# Patient Record
Sex: Female | Born: 1975
Health system: Southern US, Community
[De-identification: ages and names within clinical notes are randomized; demographics above are authoritative.]

## PROBLEM LIST (undated history)

## (undated) DIAGNOSIS — T7840XA Allergy, unspecified, initial encounter: Secondary | ICD-10-CM

## (undated) DIAGNOSIS — G43909 Migraine, unspecified, not intractable, without status migrainosus: Secondary | ICD-10-CM

## (undated) DIAGNOSIS — D259 Leiomyoma of uterus, unspecified: Secondary | ICD-10-CM

## (undated) HISTORY — DX: Leiomyoma of uterus, unspecified: D25.9

## (undated) HISTORY — PX: CHOLECYSTECTOMY: SHX55

## (undated) HISTORY — PX: SHOULDER SURGERY: SHX246

## (undated) HISTORY — DX: Migraine, unspecified, not intractable, without status migrainosus: G43.909

## (undated) HISTORY — DX: Allergy, unspecified, initial encounter: T78.40XA

---

## 1999-07-14 ENCOUNTER — Other Ambulatory Visit: Admission: RE | Admit: 1999-07-14 | Discharge: 1999-07-14 | Payer: Self-pay | Admitting: Obstetrics & Gynecology

## 1999-08-28 ENCOUNTER — Ambulatory Visit (HOSPITAL_COMMUNITY): Admission: RE | Admit: 1999-08-28 | Discharge: 1999-08-28 | Payer: Self-pay | Admitting: Obstetrics and Gynecology

## 1999-08-28 ENCOUNTER — Encounter: Payer: Self-pay | Admitting: Obstetrics and Gynecology

## 1999-11-24 ENCOUNTER — Inpatient Hospital Stay (HOSPITAL_COMMUNITY): Admission: AD | Admit: 1999-11-24 | Discharge: 1999-11-24 | Payer: Self-pay | Admitting: Obstetrics and Gynecology

## 1999-12-22 ENCOUNTER — Inpatient Hospital Stay (HOSPITAL_COMMUNITY): Admission: AD | Admit: 1999-12-22 | Discharge: 1999-12-25 | Payer: Self-pay | Admitting: Obstetrics and Gynecology

## 2000-01-05 ENCOUNTER — Inpatient Hospital Stay (HOSPITAL_COMMUNITY): Admission: AD | Admit: 2000-01-05 | Discharge: 2000-01-05 | Payer: Self-pay | Admitting: *Deleted

## 2000-06-10 ENCOUNTER — Other Ambulatory Visit: Admission: RE | Admit: 2000-06-10 | Discharge: 2000-06-10 | Payer: Self-pay | Admitting: Obstetrics & Gynecology

## 2001-06-14 ENCOUNTER — Other Ambulatory Visit: Admission: RE | Admit: 2001-06-14 | Discharge: 2001-06-14 | Payer: Self-pay | Admitting: Obstetrics and Gynecology

## 2002-05-31 ENCOUNTER — Other Ambulatory Visit: Admission: RE | Admit: 2002-05-31 | Discharge: 2002-05-31 | Payer: Self-pay | Admitting: Obstetrics and Gynecology

## 2003-01-15 ENCOUNTER — Encounter: Admission: RE | Admit: 2003-01-15 | Discharge: 2003-01-15 | Payer: Self-pay | Admitting: Family Medicine

## 2003-01-15 ENCOUNTER — Encounter: Payer: Self-pay | Admitting: Family Medicine

## 2003-06-07 ENCOUNTER — Other Ambulatory Visit: Admission: RE | Admit: 2003-06-07 | Discharge: 2003-06-07 | Payer: Self-pay | Admitting: Obstetrics and Gynecology

## 2004-06-08 ENCOUNTER — Other Ambulatory Visit: Admission: RE | Admit: 2004-06-08 | Discharge: 2004-06-08 | Payer: Self-pay | Admitting: Obstetrics and Gynecology

## 2004-07-28 ENCOUNTER — Emergency Department (HOSPITAL_COMMUNITY): Admission: EM | Admit: 2004-07-28 | Discharge: 2004-07-29 | Payer: Self-pay | Admitting: Emergency Medicine

## 2004-07-29 ENCOUNTER — Ambulatory Visit: Payer: Self-pay | Admitting: Family Medicine

## 2004-07-30 ENCOUNTER — Encounter: Admission: RE | Admit: 2004-07-30 | Discharge: 2004-07-30 | Payer: Self-pay | Admitting: Family Medicine

## 2004-08-18 ENCOUNTER — Encounter (INDEPENDENT_AMBULATORY_CARE_PROVIDER_SITE_OTHER): Payer: Self-pay | Admitting: *Deleted

## 2004-08-18 ENCOUNTER — Ambulatory Visit (HOSPITAL_COMMUNITY): Admission: RE | Admit: 2004-08-18 | Discharge: 2004-08-19 | Payer: Self-pay | Admitting: General Surgery

## 2004-10-20 ENCOUNTER — Ambulatory Visit: Payer: Self-pay | Admitting: Family Medicine

## 2004-11-25 ENCOUNTER — Ambulatory Visit: Payer: Self-pay | Admitting: Family Medicine

## 2004-12-01 ENCOUNTER — Encounter: Admission: RE | Admit: 2004-12-01 | Discharge: 2004-12-01 | Payer: Self-pay | Admitting: Family Medicine

## 2004-12-01 ENCOUNTER — Ambulatory Visit: Payer: Self-pay | Admitting: Family Medicine

## 2005-08-30 ENCOUNTER — Ambulatory Visit: Payer: Self-pay | Admitting: Cardiology

## 2005-08-30 ENCOUNTER — Ambulatory Visit: Payer: Self-pay | Admitting: Family Medicine

## 2005-10-07 ENCOUNTER — Ambulatory Visit: Payer: Self-pay | Admitting: Family Medicine

## 2005-10-11 ENCOUNTER — Ambulatory Visit: Payer: Self-pay | Admitting: Family Medicine

## 2005-10-11 ENCOUNTER — Encounter: Admission: RE | Admit: 2005-10-11 | Discharge: 2005-10-11 | Payer: Self-pay | Admitting: Family Medicine

## 2005-10-15 ENCOUNTER — Encounter: Admission: RE | Admit: 2005-10-15 | Discharge: 2005-10-15 | Payer: Self-pay | Admitting: Family Medicine

## 2005-12-30 ENCOUNTER — Ambulatory Visit: Payer: Self-pay | Admitting: Internal Medicine

## 2006-08-04 ENCOUNTER — Ambulatory Visit: Payer: Self-pay | Admitting: Family Medicine

## 2006-12-01 ENCOUNTER — Ambulatory Visit: Payer: Self-pay | Admitting: Internal Medicine

## 2006-12-05 ENCOUNTER — Ambulatory Visit: Payer: Self-pay | Admitting: Internal Medicine

## 2006-12-05 LAB — CONVERTED CEMR LAB
Eosinophils Absolute: 0.1 10*3/uL (ref 0.0–0.6)
Glucose, Bld: 73 mg/dL (ref 70–99)
HDL: 48.2 mg/dL (ref >39.0)
MCV: 80.3 fL (ref 78.0–100.0)
Monocytes Relative: 7.3 % (ref 3.0–11.0)
Neutro Abs: 4.1 10*3/uL (ref 1.4–7.7)
TSH: 1.86 microintl units/mL (ref 0.35–5.50)
VLDL: 18 mg/dL (ref 0–40)
WBC: 7 10*3/uL (ref 4.5–10.5)

## 2007-01-16 DIAGNOSIS — G43909 Migraine, unspecified, not intractable, without status migrainosus: Secondary | ICD-10-CM | POA: Insufficient documentation

## 2007-01-16 DIAGNOSIS — Z87898 Personal history of other specified conditions: Secondary | ICD-10-CM | POA: Insufficient documentation

## 2009-04-24 ENCOUNTER — Ambulatory Visit: Payer: Self-pay | Admitting: Family Medicine

## 2009-04-24 LAB — CONVERTED CEMR LAB
Bilirubin Urine: NEGATIVE
Urobilinogen, UA: NEGATIVE
pH: 6

## 2009-04-27 LAB — CONVERTED CEMR LAB
AST: 23 units/L (ref 0–37)
Alkaline Phosphatase: 45 units/L (ref 39–117)
BUN: 8 mg/dL (ref 6–23)
Bilirubin, Direct: 0 mg/dL (ref 0.0–0.3)
Cholesterol: 186 mg/dL (ref 0–200)
Glucose, Bld: 82 mg/dL (ref 70–99)
HCT: 35.4 % — ABNORMAL LOW (ref 36.0–46.0)
Hemoglobin: 11.7 g/dL — ABNORMAL LOW (ref 12.0–15.0)
Lymphs Abs: 1.9 10*3/uL (ref 0.7–4.0)
MCHC: 33.1 g/dL (ref 30.0–36.0)
Monocytes Absolute: 0.3 10*3/uL (ref 0.1–1.0)
Monocytes Relative: 5.5 % (ref 3.0–12.0)
Neutro Abs: 2.5 10*3/uL (ref 1.4–7.7)
RBC: 4.18 M/uL (ref 3.87–5.11)
RDW: 13.7 % (ref 11.5–14.6)
Total CHOL/HDL Ratio: 4
Triglycerides: 90 mg/dL (ref 0.0–149.0)
VLDL: 18 mg/dL (ref 0.0–40.0)

## 2009-04-30 ENCOUNTER — Telehealth (INDEPENDENT_AMBULATORY_CARE_PROVIDER_SITE_OTHER): Payer: Self-pay | Admitting: *Deleted

## 2009-09-02 ENCOUNTER — Ambulatory Visit: Payer: Self-pay | Admitting: Family Medicine

## 2009-09-02 DIAGNOSIS — M549 Dorsalgia, unspecified: Secondary | ICD-10-CM | POA: Insufficient documentation

## 2009-09-30 ENCOUNTER — Ambulatory Visit: Payer: Self-pay | Admitting: Family Medicine

## 2009-09-30 DIAGNOSIS — D509 Iron deficiency anemia, unspecified: Secondary | ICD-10-CM | POA: Insufficient documentation

## 2009-10-01 ENCOUNTER — Encounter (INDEPENDENT_AMBULATORY_CARE_PROVIDER_SITE_OTHER): Payer: Self-pay | Admitting: *Deleted

## 2009-10-01 LAB — CONVERTED CEMR LAB
Basophils Relative: 0.7 % (ref 0.0–3.0)
Eosinophils Absolute: 0.3 10*3/uL (ref 0.0–0.7)
Eosinophils Relative: 4.7 % (ref 0.0–5.0)
Ferritin: 8.7 ng/mL — ABNORMAL LOW (ref 10.0–291.0)
HCT: 37.8 % (ref 36.0–46.0)
Lymphs Abs: 1.7 10*3/uL (ref 0.7–4.0)
Monocytes Absolute: 0.4 10*3/uL (ref 0.1–1.0)
Monocytes Relative: 6.7 % (ref 3.0–12.0)
Neutro Abs: 3.3 10*3/uL (ref 1.4–7.7)
RDW: 12.5 % (ref 11.5–14.6)
Saturation Ratios: 13.9 % — ABNORMAL LOW (ref 20.0–50.0)
Vitamin B-12: 626 pg/mL (ref 211–911)
WBC: 5.7 10*3/uL (ref 4.5–10.5)

## 2009-10-14 ENCOUNTER — Other Ambulatory Visit: Admission: RE | Admit: 2009-10-14 | Discharge: 2009-10-14 | Payer: Self-pay | Admitting: Family Medicine

## 2009-10-14 ENCOUNTER — Ambulatory Visit: Payer: Self-pay | Admitting: Family Medicine

## 2009-11-03 ENCOUNTER — Telehealth: Payer: Self-pay | Admitting: Family Medicine

## 2010-10-27 NOTE — Assessment & Plan Note (Signed)
Summary: PAP/NS/KDC   Vital Signs:  Patient profile:   35 year old female Weight:      183 pounds Temp:     98.3 degrees F oral Pulse rate:   68 / minute Pulse rhythm:   regular BP sitting:   110 / 70  (left arm) Cuff size:   large  Vitals Entered By: Army Fossa CMA (September 30, 2009 10:17 AM) CC: Pap   History of Present Illness: Pt here for gyn exam.  No complaints.   rest of cpe done at last visit  Current Medications (verified): 1)  Multivitamins  Tabs (Multiple Vitamin) .Marland Kitchen.. 1 By Mouth Once Daily 2)  Tandem F 162-115.2-1 Mg Caps (Ferrous Fum-Iron Polysacch-Fa) .Marland Kitchen.. 1 By Mouth Once Daily  Allergies (verified): No Known Drug Allergies  Past History:  Family History: Last updated: 04/24/2009 Family History Diabetes 1st degree relative Family History Hypertension Alz. 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  Past Medical History: Reviewed history from 04/24/2009 and no changes required. Unremarkable  Past Surgical History: Reviewed history from 01/16/2007 and no changes required. Cholecystectomy  RIGHT SHOULDER SURGERY  Family History: Reviewed history from 04/24/2009 and no changes required. Family History Diabetes 1st degree relative Family History Hypertension Alz. dementia  Social History: Reviewed history from 04/24/2009 and no changes required. 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 Breasts:  No mass, nodules, thickening, tenderness, bulging, retraction, inflamation, nipple discharge or skin changes noted.   Lungs:  Normal respiratory effort, chest expands symmetrically. Lungs are clear to auscultation, no crackles or wheezes. Heart:  Normal rate and regular rhythm. S1 and S2 normal without gallop, murmur, click, rub or other extra sounds. Abdomen:  Bowel sounds positive,abdomen soft and non-tender without masses, organomegaly or hernias noted. Genitalia:  unable to do pap secondary to period Psych:  Oriented X3 and normally interactive.     Impression & Recommendations:  Problem # 1:  ROUTINE GYNECOLOGICAL EXAMINATION (ICD-V72.31)  unable to do pap secondary to period recheck 2-3 weeks  Orders: No Charge Patient Arrived (NCPA0) (NCPA0)  Problem # 2:  ANEMIA, IRON DEFICIENCY (ICD-280.9)  Her updated medication list for this problem includes:    Tandem F 162-115.2-1 Mg Caps (Ferrous fum-iron polysacch-fa) .Marland Kitchen... 1 by mouth once daily  Orders: Venipuncture (96295) TLB-B12 + Folate Pnl (28413_24401-U27/OZD) TLB-IBC Pnl (Iron/FE;Transferrin) (83550-IBC) TLB-CBC Platelet - w/Differential (85025-CBCD) TLB-Ferritin (82728-FER) No Charge Patient Arrived (NCPA0) (NCPA0)  Hgb: 11.7 (04/24/2009)   Hct: 35.4 (04/24/2009)   Platelets: 271.0 (04/24/2009) RBC: 4.18 (04/24/2009)   RDW: 13.7 (04/24/2009)   WBC: 4.9 (04/24/2009) MCV: 84.7 (04/24/2009)   MCHC: 33.1 (04/24/2009) TSH: 1.46 (04/24/2009)  Complete Medication List: 1)  Multivitamins Tabs (Multiple vitamin) .Marland Kitchen.. 1 by mouth once daily 2)  Tandem F 162-115.2-1 Mg Caps (Ferrous fum-iron polysacch-fa) .Marland Kitchen.. 1 by mouth once daily

## 2010-10-27 NOTE — Progress Notes (Signed)
Summary: WANTS TO GO BACK TO "THE PILL"  Phone Note Call from Patient Call back at [phone]   Caller: Patient Summary of Call: PATIENT SAYS THAT BIRTH CONTROL PATCH IS MAKING HER LEGS FEEL NUMB---WANTS TO GO BACK TO THE PILL--PLEASE CALL [facility] ON [address]--PLEASE CALL HER WHEN NEW PRESCRIPTION HAS BEEN CALLED IN  Follow-up for Phone Call        yaz #1  as directed 11 refills Follow-up by: Loreen Freud DO,  November 03, 2009 1:54 PM  Additional Follow-up for Phone Call Additional follow up Details #1::        Pt is aware. Army Fossa CMA  November 03, 2009 1:56 PM     New/Updated Medications: YAZ 3-0.02 MG TABS (DROSPIRENONE-ETHINYL ESTRADIOL) as directed. Prescriptions: YAZ 3-0.02 MG TABS (DROSPIRENONE-ETHINYL ESTRADIOL) as directed.  #1 x 11    Entered by:   Army Fossa CMA   Authorized by:   Loreen Freud DO   Signed by:   Army Fossa CMA on 11/03/2009   Method used:   Faxed to ...       [facility] [address] (retail)       [phone]       [place], [place]  [address]       Ph: [phone]       Fax: [phone]   RxID:   [id number]

## 2010-10-27 NOTE — Assessment & Plan Note (Signed)
Summary: PAP/NS/KDC   Vital Signs:  Patient profile:   35 year old female Weight:      182 pounds Temp:     99.2 degrees F oral Pulse rate:   72 / minute Pulse rhythm:   regular BP sitting:   112 / 80  (left arm) Cuff size:   large  Vitals Entered By: Army Fossa CMA (October 14, 2009 10:01 AM) CC: PAP only.    History of Present Illness: Pt here for pap only.  Rest of exam done last visit.     Current Medications (verified): 1)  Multivitamins  Tabs (Multiple Vitamin) .Marland Kitchen.. 1 By Mouth Once Daily 2)  Tandem F 162-115.2-1 Mg Caps (Ferrous Fum-Iron Polysacch-Fa) .Marland Kitchen.. 1 By Mouth Once Daily 3)  Ortho Evra 150-20 Mcg/24hr Ptwk (Norelgestromin-Eth Estradiol) .Marland Kitchen.. 1 Patch Q Week For 3 Weeks Then Off 1 Week  Allergies (verified): No Known Drug Allergies  Past History:  Past medical, surgical, family and social histories (including risk factors) reviewed for relevance to current acute and chronic problems.  Past Medical History: Reviewed history from 04/24/2009 and no changes required. Unremarkable  Past Surgical History: Reviewed history from 01/16/2007 and no changes required. Cholecystectomy  RIGHT SHOULDER SURGERY  Family History: Reviewed history from 04/24/2009 and no changes required. Family History Diabetes 1st degree relative Family History Hypertension Alz. dementia  Social History: Reviewed history from 04/24/2009 and no changes required. 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 Genitalia:  Pelvic Exam:        External: normal female genitalia without lesions or masses        Vagina: normal without lesions or masses  Cervix: normal without lesions or masses        Adnexa: normal bimanual exam without masses or fullness        Uterus: normal by palpation        Pap smear: performed Cervical Nodes:  No lymphadenopathy noted Psych:  Cognition and judgment appear intact. Alert and cooperative with normal attention span and concentration. No apparent delusions, illusions, hallucinations   Impression & Recommendations:  Problem # 1:  ROUTINE GYNECOLOGICAL EXAMINATION (ICD-V72.31) see last ov  pap done rx ortho evra---pt understands risks and how to use  Problem # 2:  ANEMIA, IRON DEFICIENCY (ICD-280.9)  Her updated medication list for this problem includes:    Tandem F 162-115.2-1 Mg Caps (Ferrous fum-iron polysacch-fa) .Marland Kitchen... 1 by mouth once daily  Hgb: 12.2 (09/30/2009)   Hct: 37.8 (09/30/2009)   Platelets: 265.0 (09/30/2009) RBC: 4.21 (09/30/2009)   RDW: 12.5 (09/30/2009)   WBC: 5.7 (09/30/2009) MCV: 89.8 (09/30/2009)   MCHC: 32.3 (09/30/2009) Ferritin: 8.7 (09/30/2009) Iron: 64 (09/30/2009)   % Sat: 13.9 (09/30/2009) B12: 626 (09/30/2009)   Folate: >20.0 ng/mL (09/30/2009)   TSH: 1.46 (04/24/2009)  Complete Medication List: 1)  Multivitamins Tabs (Multiple vitamin) .Marland Kitchen.. 1 by mouth once daily 2)  Tandem F 162-115.2-1 Mg Caps (Ferrous fum-iron polysacch-fa) .Marland Kitchen.. 1 by mouth once daily 3)  Ortho Evra 150-20 Mcg/24hr Ptwk (Norelgestromin-eth estradiol) .Marland Kitchen.. 1 patch q week for 3 weeks then off 1 week Prescriptions: ORTHO EVRA 150-20 MCG/24HR PTWK (NORELGESTROMIN-ETH ESTRADIOL) 1 patch q week for 3 weeks then off 1 week  #3 x 11  Entered and Authorized by:   Loreen Freud DO   Signed by:   Loreen Freud DO on 10/14/2009   Method used:   Electronically to        [facility] [address] (retail)       [phone]       [place], [place]  [address]       Ph: [phone]       Fax: [phone]   RxID:   [id number]

## 2010-10-27 NOTE — Letter (Signed)
Summary: Results Follow up Letter  [facility]  [address] [place], [place] [address]   Phone: [phone]  Fax: [phone]    10/01/2009 MRN: [id number]  Sheena Olsen [address] [place], [place]  [address]  Dear [name],  The following are the results of your recent test(s):  Test         Result    Pap Smear:        Normal _____  Not Normal _____ Comments: ______________________________________________________ Cholesterol: LDL(Bad cholesterol):         Your goal is less than:         HDL (Good cholesterol):       Your goal is more than: Comments:  ______________________________________________________ Mammogram:        Normal _____  Not Normal _____ Comments:  ___________________________________________________________________ Hemoccult:        Normal _____  Not normal _______ Comments:    _____________________________________________________________________ Other Tests:  See attachment for results.  We routinely do not discuss normal results over the telephone.  If you desire a copy of the results, or you have any questions about this information we can discuss them at your next office visit.   Sincerely,  Army Fossa CMA  October 01, 2009 8:15 AM

## 2011-02-12 NOTE — Op Note (Signed)
[facility]  Patient:    Sheena Olsen, Sheena Olsen                       MRN: [id number] Proc. Date: 12/22/99 Adm. Date:  [id number] Attending:  Dierdre Forth Pearline                           Operative Report  PREOPERATIVE DIAGNOSIS:       Intrauterine pregnancy at term.  Prior cesarean section of unknown type, desire for repeat cesarean section.  POSTOPERATIVE DIAGNOSIS:      Intrauterine pregnancy at term.  Prior cesarean section of unknown type, desire for repeat cesarean section.  OPERATION:                    Repeat low transverse cesarean section.  SURGEON:                      Vanessa P. Pennie Rushing, M.D.  ASSISTANT:                    Erin Sons, C.N.M.  ANESTHESIA:                   Spinal.  ESTIMATED BLOOD LOSS:         1000 cc.  COMPLICATIONS:                None.  FINDINGS:                     The uterus was enlarged for the gravid state and he tubes bilaterally appeared normal.  The left ovary was visualized and appeared normal.  The right ovary could not be visualized.  There were extensive anterior fundal adhesions to the anterior peritoneum.  The patient was delivered of a female infant weighing 7 pounds 11 ounces with Apgars of 8 and 9 at one and five minutes, respectively.  DESCRIPTION OF PROCEDURE:     The patient was taken to the operating room after  appropriate identification and placed on the operating table.  After placement f a spinal anesthetic, she was placed in the supine position with a left lateral tilt. The abdomen and perineum were prepped with multiple layers of Betadine and a Foley catheter inserted into the bladder under sterile conditions and connected to straight drainage.  The abdomen was draped as a sterile field.  After assurance of adequate anesthesia, a transverse incision was made in the abdomen and the abdomen opened in layers.  The entry into the peritoneum was difficult secondary to multiple  anterior fundal adhesions.  Once entry had been documented, the adhesions were bluntly lysed and the visceroperitoneum overlying the uterus incised and the incision taken laterally on either side.  The bladder flap was bluntly developed and the bladder blade placed.  The uterus was incised in the midline and then incision taken laterally on either side.  The infant was delivered from the occipitoposterior position with the aid of a Mity-Vac vacuum extractor and after having the nares and pharynx suctioned and the cord clamped and cut, was handed off to the awaiting pediatricians.  The appropriate cord blood was drawn and the placenta manually removed from the uterus.  The uterine cavity was cleared of products of conception with laparotomy pads.  The uterine incision was closed with a running interlocking suture of 0 Vicryl.  Hemostasis was achieved with figure-of-eight  sutures of 0 Vicryl.  Copious irrigation was carried out.  The bladder flap was repaired with a running suture of 2-0 Vicryl.  The rectus muscles were reapproximated in the midline with figure-of-eight sutures of 2-0 Vicryl. The rectus fascia was closed with a running suture of 0 Vicryl and then reinforced n either side of the midline with figure-of-eight sutures of 0 Vicryl.  Copious irrigation was carried out and hemostasis achieved with Bovie cautery.  Skin staples were applied to the skin incision and a sterile pressure dressing was applied.  The patient was taken from the operating room to the recovery room in  satisfactory condition having tolerated the procedure well with sponge, needle, and instrument counts were correct.  The infant went to the fullterm nursery. DD:  12/22/99 TD:  12/22/99 Job: [id number]

## 2011-02-12 NOTE — Discharge Summary (Signed)
[facility]  Patient:    Sheena Olsen, Sheena Olsen                       MRN: [id number] Adm. Date:  [id number] Disc. Date: 12/25/99 Attending:  Shaune Spittle Dictator:   Miguel Dibble, C.N.M.                           Discharge Summary  ADMISSION DIAGNOSES:          1. Intrauterine pregnancy at term.                               2. Previous cesarean section of unknown type.                               3. Positive Group B Strep.  DISCHARGE DIAGNOSES:          1. Intrauterine pregnancy at term.                               2. Previous cesarean section of unknown type.                               3. Delivered by repeat cesarean section, lower  segment transverse incision.                               4. Viable babygirl, weight 7 pounds 11 ounces,                                  Apgars 8 and 9.                               5. Anterior uterine adhesions.  PROCEDURE:                    Spinal anesthesia, fetal monitoring.  HOSPITAL COURSE:              On March 27, Ms. Esmeralda Malay was admitted for repeat cesarean section.  She had a previous history of positive Group B Strep nd hypertension in her first pregnancy which did not present in her second pregnancy. Due to the unknown nature of her previous cesarean section scar that was performed under emergency conditions.  The decision was made to repeat her cesarean section. She delivered a viable babygirl, Melissa, weighing 7 pounds 11 ounces, Apgars 8 and 9.  She had extensive anterior uterine adhesions.  On postoperative day #1, her  temperature during that 24-hour period climbed to as high as 100.9 and hemoglobin dropped from 12.3 to hemoglobin of 9.2.  White count increased from 8.8 to 10.7. Incision was clean, dry, and intact.  Abdomen soft and appropriately tender. On postoperative day #2, she was afebrile by morning.  Her maximum temperature overnight had been 100.9.  Her  incision was clean, dry, and intact.  Lungs were  clear and she was tolerating her diet well.  She desired Depo-Provera for contraception.  On postoperative day #2, her maximum temperature  over the previous 24-hours was 98.1.  Incision was clean and dry.  She had some breastfeeding issues, but baby appeared to have been latching on well.  She was voiding well, lochia as small.  On postoperative day #3, she appeared to have developed a small 1 cm hematoma in the center of her incision, however, there was no wound dehiscence, no drainage.  She was afebrile for greater than 24 hours.  Her abdomen was soft and nontender.  Her lungs were clear.  Extremities had trace edema, DTRs +1, negative Homans.  The decision was made to maintain her staples until April 2 at which time they would be removed in the office and Steri-Strips applied.  She was discharged home in stable condition.  DISCHARGE INSTRUCTIONS:       Per [facility] and Gynecology booklet.  Wound care by applying Maalox over the weekend.  FOLLOW-UP:                    On December 28, 1999, and also at six weeks postoperative.  DISCHARGE MEDICATIONS:        1. Tylox.                               2. Motrin.                               3. Depo-Provera prior to discharge. DD:  12/25/99 TD:  12/25/99 Job: [id number]

## 2011-02-12 NOTE — Op Note (Signed)
Sheena Olsen, CARTA NO.:  [id number]   MEDICAL RECORD NO.:  [id number]          PATIENT TYPE:  OIB   LOCATION:  2550                         FACILITY:  [facility]   PHYSICIAN:  Gita Kudo, M.D. DATE OF BIRTH:  1975-10-29   DATE OF PROCEDURE:  08/18/2004  DATE OF DISCHARGE:                                 OPERATIVE REPORT   PREOPERATIVE DIAGNOSIS:  Gallstones.   POSTOPERATIVE DIAGNOSIS:  Gallstones.   OPERATION PERFORMED:  Laparoscopic cholecystectomy with intraoperative  cholangiogram.   SURGEON:  Gita Kudo, M.D.   ASSISTANT:  Sandria Bales. Ezzard Standing, M.D.   ANESTHESIA:  General.   INDICATIONS FOR PROCEDURE:  The patient is a 35 year old female with severe  bouts of right upper quadrant abdominal pain.  Gallbladder ultrasound shows  multiple stones.  Liver function studies were normal.   OPERATIVE FINDINGS:  The gallbladder was distended and filled with multiple  stones.  The cystic duct and artery were normal in anatomy and the  cholangiogram looked normal.  A small hole was made in the gallbladder  during the dissection but fortunately only a little bile came out and no  gallstones.   DESCRIPTION OF PROCEDURE:  Under satisfactory general endotracheal  anesthesia, the patient was positioned, prepped and draped in standard  fashion.  She received IV Ancef preop.  A 30 mL of 0.5% Marcaine with  epinephrine was infiltrated for postoperative analgesia.  A transverse  incision was made above the umbilicus, carried down to the midline, which  was opened into the peritoneum.  Controlled with a figure-of-eight 0 Vicryl  suture, operating Hasson port inserted and secured.  Good CO2  pneumoperitoneum was established.  Under direct vision, two #5 ports placed  laterally and a second #10 medially.  With lateral graspers giving good  exposure, operating through the medial port, I carefully dissected the  cystic duct and cystic artery.  The duct was controlled  with a single clip  near the gallbladder and an incision made in it and percutaneous catheter  placed with good cholangiogram obtained.  The catheter was drawn and the  duct controlled with multiple clips distally and divided.  The main artery  had also posterior branches divided between multiple clips.  The gallbladder  removed from below upward using coagulating spatula for hemostasis and  dissection.  It was a little thin-walled and a small hole made with spillage  of a small amount of clear bile.  The dissection continued and then when  removed, the gallbladder was placed in an EndoCatch bag.  Following this,  the operative site was checked for hemostasis, lavaged with saline and  suctioned dry.  Cameral then moved to the lower port and through the  umbilical port, a large grasper used to retrieve the encased gallbladder  intact and without spillage or problem.   I felt the gallbladder and it was packed with tiny stones and fortunately  none of these escaped into the abdominal cavity.  Then the operative site  again checked, lavaged and suctioned dry.  CO2 and ports were released.  Midline  closed with a  previous figure-of-eight as well as a second interrupted 0  Vicryl suture.  Then subcu approximated with 4-0 Vicryl and Steri-Strips for  skin.  Sterile absorbent dressings were then applied and the patient went to  the recovery room from the operating room in good condition without  complication.       MRL/MEDQ  D:  08/18/2004  T:  08/18/2004  Job:  [id number]   cc:   Loreen Freud, M.D.

## 2011-02-12 NOTE — H&P (Signed)
[facility]  Patient:    Sheena Olsen, Sheena Olsen                       MRN: [id number] Adm. Date:  [id number] Disc. Date: [id number] Attending:  Shaune Spittle Dictator:   Nigel Bridgeman, C.N.M.                         History and Physical  HISTORY OF PRESENT ILLNESS:   [name] is a 35 year old gravida 3, para 2-0-0-1 at 38-6/7 weeks, who presents for scheduled repeat cesarean section.  Pregnancy has been remarkable for:  #1 - Two previous cesarean sections, the first one with an unknown incision type; #2 - previous stillbirth; #3 - family history of pyloric  stenosis; #4 - positive group B strep.  PRENATAL LABORATORY DATA:     Blood type is AB-positive.  Rh-antibody negative.  VDRL nonreactive.  Rubella titer positive.  Hepatitis B surface antigen negative. Sickle cell test negative.  Pap was normal.  GC and Chlamydia cultures were negative in the first trimester.  TSH was normal.  Pap was also normal. Glucose challenge was normal.  AFP was declined.  Group B strep was positive for a previous pregnancy.  Hemoglobin upon entry into practice was 13.4; it was within normal limits at 28 weeks.  EDC of December 29, 1999 was established by ultrasound in the first trimester secondary to questionable last menstrual period.  HISTORY OF PRESENT PREGNANCY:  Patient entered care at [facility] at approximately 16 weeks.  She had been followed for prenatal care in [place]. She denied AFP.  She reported after her last cesarean they saw that she had a horizontal incision on the uterus with her first C-section.  This C-section occurred in [place] but no records were able to be obtained; therefore, Dr. Erie Noe P. Olsen recommended repeat cesarean section.  She had an ultrasound at [facility] secondary to family history of pyloric stenosis.  The rest of the patients pregnancy was essentially uncomplicated.  OBSTETRICAL HISTORY:          In  1990, patient had a C-section under general anesthesia for a female infant at 82 weeks.  She did have a hemorrhage and that  infant was stillborn.  There were no records available from that birth.  In 1999, she had a C-section under epidural anesthesia for a female infant, weight 8 pounds 8 ounces, at 40-weeks gestation.  She did have decreased blood pressure during her delivery but otherwise had a normal pregnancy.  She did have elevated blood pressure during her 1990 pregnancy.  MEDICAL HISTORY:              She was on oral contraceptives until April of 2000. She reports the usual childhood illnesses.  In 1997, she had a UTI.  At age 45, he had hernia surgery.  Her only other hospitalizations were for childbirth and her two previous cesarean sections.  ALLERGIES:                    She has no known medication allergies.  FAMILY HISTORY:               Her maternal aunt has hypertension.  Her mother and sister have asthma.  Her maternal great-aunt has diabetes.  GENETIC HISTORY:              Genetic history is remarkable for the father of  the baby and the father of the babys niece having pyloric stenosis.  SOCIAL HISTORY:               Patient is married to the father of the baby; he s involved and supportive.  His name is Smitty Cords.  The patient is high-school-educated.  She is a Futures trader.  Her husband is also high-school-educated and he is employed as a Production designer, theatre/television/film.  She has been followed by the physicians service at [facility].  She denies any alcohol, drug or tobacco use during this pregnancy.  PHYSICAL EXAMINATION:  VITAL SIGNS:                  Stable.  Patient is afebrile.  HEENT:                        Within normal limits.  LUNGS:                        Bilateral breath sounds are clear.  HEART:                        Regular rate and rhythm without murmur.  BREASTS:                      Soft and nontender.  ABDOMEN:                      Fundal  height is approximately 39 cm.  Estimated fetal weight is 8 to 8-1/2 pounds.  Uterine contractions are very occasional and mild.  Fetal heart rate is in the 150s by Doppler.  EXTREMITIES:                  Deep tendon reflexes are 2+ without clonus. There is a trace edema noted.  IMPRESSION:                   1. Intrauterine pregnancy at 38-6/7 weeks.                               2. Two previous cesarean sections, with planned                                  repeat.                               3. History of positive group B streptococcus.                               4. History of previous stillborn.  PLAN:                         1. Admit to birthing suite per consult with                                  Dr. Maris Berger. Olsen as attending physician.  2. Routine physician preop C-section orders. DD:  12/22/99 TD:  12/22/99 Job: [id number]

## 2011-11-23 ENCOUNTER — Other Ambulatory Visit (HOSPITAL_COMMUNITY)
Admission: RE | Admit: 2011-11-23 | Discharge: 2011-11-23 | Disposition: A | Discharge status: Home / Self Care | Payer: BC Managed Care – PPO | Source: Ambulatory Visit | Attending: Obstetrics and Gynecology | Admitting: Obstetrics and Gynecology

## 2011-11-23 ENCOUNTER — Other Ambulatory Visit: Payer: Self-pay | Admitting: Nurse Practitioner

## 2011-11-23 DIAGNOSIS — Z1159 Encounter for screening for other viral diseases: Secondary | ICD-10-CM | POA: Insufficient documentation

## 2011-11-23 DIAGNOSIS — Z01419 Encounter for gynecological examination (general) (routine) without abnormal findings: Secondary | ICD-10-CM | POA: Insufficient documentation

## 2011-11-23 DIAGNOSIS — Z113 Encounter for screening for infections with a predominantly sexual mode of transmission: Secondary | ICD-10-CM | POA: Insufficient documentation

## 2012-02-15 ENCOUNTER — Ambulatory Visit (INDEPENDENT_AMBULATORY_CARE_PROVIDER_SITE_OTHER): Payer: BC Managed Care – PPO | Admitting: Family Medicine

## 2012-02-15 ENCOUNTER — Encounter: Payer: Self-pay | Admitting: Family Medicine

## 2012-02-15 VITALS — BP 112/74 | HR 84 | Temp 98.7°F | Ht 61.0 in | Wt 185.0 lb

## 2012-02-15 DIAGNOSIS — J309 Allergic rhinitis, unspecified: Secondary | ICD-10-CM

## 2012-02-15 DIAGNOSIS — J302 Other seasonal allergic rhinitis: Secondary | ICD-10-CM

## 2012-02-15 NOTE — Progress Notes (Signed)
  Subjective:     Sheena Olsen is a 36 y.o. female who presents for evaluation and treatment of allergic symptoms. Symptoms include: clear rhinorrhea and are present in a seasonal pattern. Precipitants include: dust, pollen she thinks.. Treatment currently includes oral antihistamines: allegra and zyrtec  and is not effective. The following portions of the patient's history were reviewed and updated as appropriate: allergies, current medications, past family history, past medical history, past social history, past surgical history and problem list.  Review of Systems Pertinent items are noted in HPI.    Objective:    BP 112/74  Pulse 84  Temp(Src) 98.7 F (37.1 C) (Oral)  Ht 5\' 1"  (1.549 m)  Wt 185 lb (83.915 kg)  BMI 34.96 kg/m2  SpO2 99% General appearance: alert, cooperative, appears stated age and no distress Ears: normal TM's and external ear canals both ears   cor  +s1s2  Lungs --Ctab;l  Nose--clear drainage Assessment:    Allergic rhinitis.    Plan:    Medications: intranasal steroids: -, intranasal antihistamines: -, oral antihistamines: -. Allergen avoidance discussed. Follow-up in 4 weeks.

## 2012-02-15 NOTE — Patient Instructions (Signed)
Allergies, Generic Allergies may happen from anything your body is sensitive to. This may be food, medicines, pollens, chemicals, and nearly anything around you in everyday life that produces allergens. An allergen is anything that causes an allergy producing substance. Heredity is often a factor in causing these problems. This means you may have some of the same allergies as your parents. Food allergies happen in all age groups. Food allergies are some of the most severe and life threatening. Some common food allergies are cow's milk, seafood, eggs, nuts, wheat, and soybeans. SYMPTOMS   Swelling around the mouth.   An itchy red rash or hives.   Vomiting or diarrhea.   Difficulty breathing.  SEVERE ALLERGIC REACTIONS ARE LIFE-THREATENING. This reaction is called anaphylaxis. It can cause the mouth and throat to swell and cause difficulty with breathing and swallowing. In severe reactions only a trace amount of food (for example, peanut oil in a salad) may cause death within seconds. Seasonal allergies occur in all age groups. These are seasonal because they usually occur during the same season every year. They may be a reaction to molds, grass pollens, or tree pollens. Other causes of problems are house dust mite allergens, pet dander, and mold spores. The symptoms often consist of nasal congestion, a runny itchy nose associated with sneezing, and tearing itchy eyes. There is often an associated itching of the mouth and ears. The problems happen when you come in contact with pollens and other allergens. Allergens are the particles in the air that the body reacts to with an allergic reaction. This causes you to release allergic antibodies. Through a chain of events, these eventually cause you to release histamine into the blood stream. Although it is meant to be protective to the body, it is this release that causes your discomfort. This is why you were given anti-histamines to feel better. If you are  unable to pinpoint the offending allergen, it may be determined by skin or blood testing. Allergies cannot be cured but can be controlled with medicine. Hay fever is a collection of all or some of the seasonal allergy problems. It may often be treated with simple over-the-counter medicine such as diphenhydramine. Take medicine as directed. Do not drink alcohol or drive while taking this medicine. Check with your caregiver or package insert for child dosages. If these medicines are not effective, there are many new medicines your caregiver can prescribe. Stronger medicine such as nasal spray, eye drops, and corticosteroids may be used if the first things you try do not work well. Other treatments such as immunotherapy or desensitizing injections can be used if all else fails. Follow up with your caregiver if problems continue. These seasonal allergies are usually not life threatening. They are generally more of a nuisance that can often be handled using medicine. HOME CARE INSTRUCTIONS   If unsure what causes a reaction, keep a diary of foods eaten and symptoms that follow. Avoid foods that cause reactions.   If hives or rash are present:   Take medicine as directed.   You may use an over-the-counter antihistamine (diphenhydramine) for hives and itching as needed.   Apply cold compresses (cloths) to the skin or take baths in cool water. Avoid hot baths or showers. Heat will make a rash and itching worse.   If you are severely allergic:   Following a treatment for a severe reaction, hospitalization is often required for closer follow-up.   Wear a medic-alert bracelet or necklace stating the allergy.     You and your family must learn how to give adrenaline or use an anaphylaxis kit.   If you have had a severe reaction, always carry your anaphylaxis kit or EpiPen with you. Use this medicine as directed by your caregiver if a severe reaction is occurring. Failure to do so could have a fatal  outcome.  SEEK MEDICAL CARE IF:  You suspect a food allergy. Symptoms generally happen within 30 minutes of eating a food.   Your symptoms have not gone away within 2 days or are getting worse.   You develop new symptoms.   You want to retest yourself or your child with a food or drink you think causes an allergic reaction. Never do this if an anaphylactic reaction to that food or drink has happened before. Only do this under the care of a caregiver.  SEEK IMMEDIATE MEDICAL CARE IF:   You have difficulty breathing, are wheezing, or have a tight feeling in your chest or throat.   You have a swollen mouth, or you have hives, swelling, or itching all over your body.   You have had a severe reaction that has responded to your anaphylaxis kit or an EpiPen. These reactions may return when the medicine has worn off. These reactions should be considered life threatening.  MAKE SURE YOU:   Understand these instructions.   Will watch your condition.   Will get help right away if you are not doing well or get worse.  Document Released: 12/07/2002 Document Revised: 09/02/2011 Document Reviewed: 05/13/2008 [employer] Patient Information 2012 [employer]. 

## 2012-02-16 LAB — CBC WITH DIFFERENTIAL/PLATELET
Basophils Relative: 0.7 % (ref 0.0–3.0)
Eosinophils Absolute: 0.2 10*3/uL (ref 0.0–0.7)
Eosinophils Relative: 3.3 % (ref 0.0–5.0)
HCT: 32.6 % — ABNORMAL LOW (ref 36.0–46.0)
Hemoglobin: 10.1 g/dL — ABNORMAL LOW (ref 12.0–15.0)
Lymphs Abs: 2.2 10*3/uL (ref 0.7–4.0)
MCV: 69.9 fl — ABNORMAL LOW (ref 78.0–100.0)
Monocytes Absolute: 0.3 10*3/uL (ref 0.1–1.0)
Neutro Abs: 1.9 10*3/uL (ref 1.4–7.7)
Neutrophils Relative %: 41.6 % — ABNORMAL LOW (ref 43.0–77.0)

## 2012-02-17 LAB — ALLERGY FULL PROFILE
Allergen,Goose feathers, e70: <0.1 kU/L (ref <0.35)
Alternaria Alternata: <0.1 kU/L (ref <0.35)
Aspergillus fumigatus, IgG: <0.1 kU/L (ref <0.35)
Bermuda Grass: 1.38 kU/L — ABNORMAL HIGH (ref <0.35)
Candida Albicans: <0.1 kU/L (ref <0.35)
Curvularia lunata: <0.1 kU/L (ref <0.35)
D. farinae: <0.1 kU/L (ref <0.35)
Elm IgE: 1.25 kU/L — ABNORMAL HIGH (ref <0.35)
Fescue: 2.87 kU/L — ABNORMAL HIGH (ref <0.35)
Goldenrod: 1.77 kU/L — ABNORMAL HIGH (ref <0.35)
Helminthosporium halodes: <0.1 kU/L (ref <0.35)
IgE (Immunoglobulin E), Serum: 51.4 IU/mL (ref 0.0–180.0)
Stemphylium Botryosum: <0.1 kU/L (ref <0.35)

## 2012-11-11 ENCOUNTER — Other Ambulatory Visit: Payer: Self-pay

## 2013-04-30 ENCOUNTER — Other Ambulatory Visit: Payer: Self-pay | Admitting: Obstetrics and Gynecology

## 2013-04-30 ENCOUNTER — Inpatient Hospital Stay (HOSPITAL_COMMUNITY)
Admission: AD | Admit: 2013-04-30 | Discharge: 2013-04-30 | Disposition: A | Discharge status: Home / Self Care | Payer: BC Managed Care – PPO | Source: Ambulatory Visit | Attending: Obstetrics and Gynecology | Admitting: Obstetrics and Gynecology

## 2013-04-30 ENCOUNTER — Encounter (HOSPITAL_COMMUNITY): Payer: Self-pay | Admitting: *Deleted

## 2013-04-30 ENCOUNTER — Inpatient Hospital Stay (HOSPITAL_COMMUNITY): Payer: BC Managed Care – PPO

## 2013-04-30 DIAGNOSIS — N949 Unspecified condition associated with female genital organs and menstrual cycle: Secondary | ICD-10-CM | POA: Insufficient documentation

## 2013-04-30 DIAGNOSIS — N739 Female pelvic inflammatory disease, unspecified: Secondary | ICD-10-CM | POA: Insufficient documentation

## 2013-04-30 DIAGNOSIS — R102 Pelvic and perineal pain: Secondary | ICD-10-CM

## 2013-04-30 LAB — COMPREHENSIVE METABOLIC PANEL
Albumin: 3.2 g/dL — ABNORMAL LOW (ref 3.5–5.2)
CO2: 25 mEq/L (ref 19–32)
Calcium: 9.4 mg/dL (ref 8.4–10.5)
GFR calc Af Amer: 89 mL/min — ABNORMAL LOW (ref >90)
GFR calc non Af Amer: 77 mL/min — ABNORMAL LOW (ref >90)
Glucose, Bld: 83 mg/dL (ref 70–99)
Potassium: 3.6 mEq/L (ref 3.5–5.1)

## 2013-04-30 LAB — CBC WITH DIFFERENTIAL/PLATELET
Basophils Absolute: 0.1 10*3/uL (ref 0.0–0.1)
HCT: 37.9 % (ref 36.0–46.0)
Lymphocytes Relative: 27 % (ref 12–46)
MCH: 29.4 pg (ref 26.0–34.0)
MCV: 88.6 fL (ref 78.0–100.0)
Monocytes Absolute: 0.7 10*3/uL (ref 0.1–1.0)
Neutro Abs: 6.3 10*3/uL (ref 1.7–7.7)
Neutrophils Relative %: 64 % (ref 43–77)
Platelets: 255 10*3/uL (ref 150–400)

## 2013-04-30 MED ORDER — IBUPROFEN 600 MG PO TABS: 600.0000 mg | ORAL_TABLET | Freq: Four times a day (QID) | ORAL | Status: DC | PRN

## 2013-04-30 MED ORDER — IBUPROFEN 600 MG PO TABS
600.0000 mg | ORAL_TABLET | Freq: Once | ORAL | Status: AC
  Administered 2013-04-30: 600 mg via ORAL
  Filled 2013-04-30: qty 1

## 2013-04-30 MED ORDER — DOXYCYCLINE HYCLATE 100 MG PO CAPS: 100.0000 mg | ORAL_CAPSULE | Freq: Two times a day (BID) | ORAL | Status: DC

## 2013-04-30 MED ORDER — CEFTRIAXONE SODIUM 250 MG IJ SOLR
250.0000 mg | Freq: Once | INTRAMUSCULAR | Status: AC
  Administered 2013-04-30: 250 mg via INTRAMUSCULAR
  Filled 2013-04-30: qty 250

## 2013-04-30 MED ORDER — LACTATED RINGERS IV SOLN: INTRAVENOUS | Status: DC

## 2013-04-30 MED ORDER — OXYCODONE-ACETAMINOPHEN 5-325 MG PO TABS: ORAL_TABLET | ORAL | Status: DC

## 2013-04-30 MED ORDER — LACTATED RINGERS IV BOLUS (SEPSIS): 500.0000 mL | Freq: Once | INTRAVENOUS | Status: DC

## 2013-04-30 NOTE — Discharge Instructions (Signed)
Abdominal Pain (Nonspecific) Your exam might not show the exact reason you have abdominal pain. Since there are many different causes of abdominal pain, another checkup and more tests may be needed. It is very important to follow up for lasting (persistent) or worsening symptoms. A possible cause of abdominal pain in any person who still has his or her appendix is acute appendicitis. Appendicitis is often hard to diagnose. Normal blood tests, urine tests, ultrasound, and CT scans do not completely rule out early appendicitis or other causes of abdominal pain. Sometimes, only the changes that happen over time will allow appendicitis and other causes of abdominal pain to be determined. Other potential problems that may require surgery may also take time to become more apparent. Because of this, it is important that you follow all of the instructions below. HOME CARE INSTRUCTIONS   Rest as much as possible.  Do not eat solid food until your pain is gone.  While adults or children have pain: A diet of water, weak decaffeinated tea, broth or bouillon, gelatin, oral rehydration solutions (ORS), frozen ice pops, or ice chips may be helpful.  When pain is gone in adults or children: Start a light diet (dry toast, crackers, applesauce, or white rice). Increase the diet slowly as long as it does not bother you. Eat no dairy products (including cheese and eggs) and no spicy, fatty, fried, or high-fiber foods.  Use no alcohol, caffeine, or cigarettes.  Take your regular medicines unless your caregiver told you not to.  Take any prescribed medicine as directed.  Only take over-the-counter or prescription medicines for pain, discomfort, or fever as directed by your caregiver. Do not give aspirin to children. If your caregiver has given you a follow-up appointment, it is very important to keep that appointment. Not keeping the appointment could result in a permanent injury and/or lasting (chronic) pain  and/or disability. If there is any problem keeping the appointment, you must call to reschedule.  SEEK IMMEDIATE MEDICAL CARE IF:   Your pain is not gone in 24 hours.  Your pain becomes worse, changes location, or feels different.  You or your child has an oral temperature above 102 F (38.9 C), not controlled by medicine.  Your baby is older than 3 months with a rectal temperature of 102 F (38.9 C) or higher.  Your baby is 64 months old or younger with a rectal temperature of 100.4 F (38 C) or higher.  You have shaking chills.  You keep throwing up (vomiting) or cannot drink liquids.  There is blood in your vomit or you see blood in your bowel movements.  Your bowel movements become dark or black.  You have frequent bowel movements.  Your bowel movements stop (become blocked) or you cannot pass gas.  You have bloody, frequent, or painful urination.  You have yellow discoloration in the skin or whites of the eyes.  Your stomach becomes bloated or bigger.  You have dizziness or fainting.  You have chest or back pain. MAKE SURE YOU:   Understand these instructions.  Will watch your condition.  Will get help right away if you are not doing well or get worse. Document Released: 09/13/2005 Document Revised: 12/06/2011 Document Reviewed: 08/11/2009 [facility] Patient Information 2014 [place], [place].   Uterine Fibroid A uterine fibroid is a growth (tumor) that occurs in a woman's uterus. This type of tumor is not cancerous and does not spread out of the uterus. A woman can have one or many  fibroids, and the fiboid(s) can become quite large. A fibroid can vary in size, weight, and where it grows in the uterus. Most fibroids do not require medical treatment, but some can cause pain or heavy bleeding during and between periods. CAUSES  A fibroid is the result of a single uterine cell that keeps growing (unregulated), which is different than most cells in the human body. Most  cells have a control mechanism that keeps them from reproducing without control.  SYMPTOMS   Bleeding.  Pelvic pain and pressure.  Bladder problems due to the size of the fibroid.  Infertility and miscarriages depending on the size and location of the fibroid. DIAGNOSIS  A diagnosis is made by physical exam. Your caregiver may feel the lumpy tumors during a pelvic exam. Important information regarding size, location, and number of tumors can be gained by having an ultrasound. It is rare that other tests, such as a CT scan or MRI, are needed. TREATMENT   Your caregiver may recommend watchful waiting. This involves getting the fibroid checked by your caregiver to see if the fibroids grow or shrink.   Hormonal treatment or an intrauterine device (IUD) may be prescribed.   Surgery may be needed to remove the fibroids (myomectomy) or the uterus (hysterectomy). This depends on your situation. When fibroids interfere with fertility and a woman wants to become pregnant, a caregiver may recommend having the fibroids removed.  HOME CARE INSTRUCTIONS  Home care depends on how you were treated. In general:   Keep all follow-up appointments with your caregiver.   Only take medicine as told by your caregiver. Do not take aspirin. It can cause bleeding.   If you have excessive periods and soak tampons or pads in a half hour or less, contact your caregiver immediately. If your periods are troublesome but not so heavy, lie down with your feet raised slightly above your heart. Place cold packs on your lower abdomen.   If your periods are heavy, write down the number of pads or tampons you use per month. Bring this information to your caregiver.   Talk to your caregiver about taking iron pills.   Include green vegetables in your diet.   If you were prescribed a hormonal treatment, take the hormonal medicines as directed.   If you need surgery, ask your caregiver for information on your  specific surgery.  SEEK IMMEDIATE MEDICAL CARE IF:  You have pelvic pain or cramps not controlled with medicines.   You have a sudden increase in pelvic pain.   You have an increase of bleeding between and during periods.   You feel lightheaded or have fainting episodes.  MAKE SURE YOU:  Understand these instructions.  Will watch your condition.  Will get help right away if you are not doing well or get worse. Document Released: 09/10/2000 Document Revised: 12/06/2011 Document Reviewed: 10/04/2011 [facility] Patient Information 2014 [place], [place].

## 2013-04-30 NOTE — MAU Note (Signed)
Patient states she has had pelvic pain for about one week. Developed a fever today of 100.7. Was seen in the office and sent to MAU for evaluation.

## 2013-04-30 NOTE — MAU Note (Signed)
Called pt from lobby and called from radiology lobby, not present.

## 2013-04-30 NOTE — MAU Provider Note (Signed)
  History     CSN: [id number]  Arrival date and time: 04/30/13 1837   None     Chief Complaint  Patient presents with  . Pelvic Pain  . Fever   HPI  37 y/o G2P2 sent from office today for pelvic ultrasound due to presumed PID.  Temperature was 100.7.  Pt with subjective rebound, moderate pain to palpation.  Pain with ambulation. Last sexual encounter 2 months ago without a condom.  GC/Chl, Trich done in office.  UPT neg in office.  UA positive for 3+ blood in office but few leukocytes.  Denies dysuria.  Ultrasound here shows multiple fibroids, largest 6cm submucosal fundal.  Pt with h/o menorrhagia but never been diagnosed with fibroids.  Menses now normal on OCPs.  Past Medical History  Diagnosis Date  . Allergy     Past Surgical History  Procedure Laterality Date  . Cholecystectomy    . Shoulder surgery      Right  . Cesarean section      Family History  Problem Relation Age of Onset  . Diabetes    . Hypertension    . Alzheimer's disease      History  Substance Use Topics  . Smoking status: Never Smoker   . Smokeless tobacco: Never Used  . Alcohol Use: No    Allergies: No Known Allergies  Prescriptions prior to admission  Medication Sig Dispense Refill  . chlorpheniramine (EQ CHLORTABS) 4 MG tablet Take 4 mg by mouth 2 (two) times daily as needed for allergies.      Marland Kitchen MICROGESTIN FE 1/20 1-20 MG-MCG tablet Take 1 tablet by mouth daily.      . Multiple Vitamin (MULTIVITAMIN) tablet Take 1 tablet by mouth daily.        ROS Physical Exam   Blood pressure 130/80, pulse 92, temperature 99.8 F (37.7 C), temperature source Oral, resp. rate 18, height 5\' 1"  (1.549 m), weight 86.002 kg (189 lb 9.6 oz), last menstrual period 01/22/2013, SpO2 100.00%.  Physical Exam  Gen:  NAD, more comfortable Abdomen:  minimal tenderness, more pain when letting go per pt but pt does not appear to have rebound. MAU Course  Procedures    Assessment and Plan  Pelvic pain  likely due to degenerating fibroid.  Treated for PID.  S/p Rochepin. Discharge with Ibuprofen and Percocet. F/u cultures. Pt instructed to call back for pain uncontrolled with medication, fever.  Pt counseled on fibroids, treatment and management.  Pt will be moving to [place] on 8/21.  Recommend establishing care there for potential myomectomy if pain persists.  Geryl Rankins 04/30/2013, 9:40 PM

## 2013-04-30 NOTE — MAU Provider Note (Signed)
  History     CSN: [id number]  Arrival date and time: 04/30/13 1837   None     Chief Complaint  Patient presents with  . Pelvic Pain  . Fever   HPI This is a 37 y.o. female who presents from the office with diagnosis of PID.  She was seen there today and cultures were sent. She is here now to assess her pain and get a dose of Rocephin.  Then she will go home on PO antibiotics  RN Note: Patient states she has had pelvic pain for about one week. Developed a fever today of 100.7. Was seen in the office and sent to MAU for evaluation.       OB History   Grav Para Term Preterm Abortions TAB SAB Ect Mult Living   3 2  1      2       Past Medical History  Diagnosis Date  . Allergy     Past Surgical History  Procedure Laterality Date  . Cholecystectomy    . Shoulder surgery      Right  . Cesarean section      Family History  Problem Relation Age of Onset  . Diabetes    . Hypertension    . Alzheimer's disease      History  Substance Use Topics  . Smoking status: Never Smoker   . Smokeless tobacco: Never Used  . Alcohol Use: No    Allergies: No Known Allergies  Prescriptions prior to admission  Medication Sig Dispense Refill  . cetirizine (ZYRTEC) 10 MG tablet Take 10 mg by mouth daily.      . fexofenadine (ALLEGRA) 180 MG tablet Take 180 mg by mouth daily.      Marland Kitchen MICROGESTIN FE 1/20 1-20 MG-MCG tablet Take 1 tablet by mouth daily.      . Multiple Vitamin (MULTIVITAMIN) tablet Take 1 tablet by mouth daily.        Review of Systems  Constitutional: Negative for fever, chills and malaise/fatigue.  Gastrointestinal: Positive for abdominal pain. Negative for nausea, vomiting, diarrhea and constipation.  Genitourinary: Negative for dysuria.   Physical Exam   Blood pressure 130/80, pulse 92, temperature 99.8 F (37.7 C), temperature source Oral, resp. rate 18, height 5\' 1"  (1.549 m), weight 86.002 kg (189 lb 9.6 oz), last menstrual period 01/22/2013, SpO2  100.00%.  Physical Exam  Constitutional: She is oriented to person, place, and time. She appears well-developed and well-nourished. No distress.  Cardiovascular: Normal rate.   Respiratory: Effort normal.  GI: Soft. She exhibits no distension. There is no tenderness. There is no rebound and no guarding.  Musculoskeletal: Normal range of motion.  Neurological: She is alert and oriented to person, place, and time.  Skin: Skin is warm and dry.  Psychiatric: She has a normal mood and affect.    MAU Course  Procedures  MDM Consulted Dr Dion Body who will come and see patient. Motrin ordered (pt preference).  Rocephin given.   Assessment and Plan  A:  Pelvic Inflammatory Disease      No evidence of pyosalpinx or sepsis   P:  Will probably discharge home after seen by MD        [facility] 04/30/2013, 9:11 PM

## 2013-05-09 NOTE — MAU Provider Note (Signed)
Pt seen in office.  Appears more comfortable in MAU. Ultrasound with large fibroids.  Degenerating fibroid suspected.

## 2013-08-02 ENCOUNTER — Other Ambulatory Visit: Payer: Self-pay

## 2014-07-12 ENCOUNTER — Other Ambulatory Visit: Payer: Self-pay

## 2014-07-29 ENCOUNTER — Encounter (HOSPITAL_COMMUNITY): Payer: Self-pay | Admitting: *Deleted

## 2015-03-14 ENCOUNTER — Other Ambulatory Visit (HOSPITAL_COMMUNITY)
Admission: RE | Admit: 2015-03-14 | Discharge: 2015-03-14 | Disposition: A | Discharge status: Home / Self Care | Payer: Managed Care, Other (non HMO) | Source: Ambulatory Visit | Attending: Obstetrics and Gynecology | Admitting: Obstetrics and Gynecology

## 2015-03-14 ENCOUNTER — Other Ambulatory Visit: Payer: Self-pay | Admitting: Obstetrics and Gynecology

## 2015-03-14 DIAGNOSIS — Z1151 Encounter for screening for human papillomavirus (HPV): Secondary | ICD-10-CM | POA: Insufficient documentation

## 2015-03-14 DIAGNOSIS — Z01419 Encounter for gynecological examination (general) (routine) without abnormal findings: Secondary | ICD-10-CM | POA: Insufficient documentation

## 2015-03-17 LAB — CYTOLOGY - PAP

## 2015-03-25 ENCOUNTER — Encounter: Payer: Self-pay | Admitting: Family Medicine

## 2015-03-25 ENCOUNTER — Ambulatory Visit (INDEPENDENT_AMBULATORY_CARE_PROVIDER_SITE_OTHER): Payer: Managed Care, Other (non HMO) | Admitting: Family Medicine

## 2015-03-25 VITALS — BP 112/78 | HR 77 | Temp 99.5°F | Resp 16 | Ht 61.0 in | Wt 188.7 lb

## 2015-03-25 DIAGNOSIS — M25511 Pain in right shoulder: Secondary | ICD-10-CM

## 2015-03-25 NOTE — Patient Instructions (Signed)

## 2015-03-25 NOTE — Progress Notes (Signed)
Pre visit review using our clinic review tool, if applicable. No additional management support is needed unless otherwise documented below in the visit note. 

## 2015-03-25 NOTE — Progress Notes (Signed)
  Subjective:    Sheena Olsen is a 39 y.o. female who presents with right shoulder pain. The symptoms began several months ago. Aggravating factors: no known event. Pain is located between the neck and shoulder. Discomfort is described as aching. Symptoms are exacerbated by repetitive movements, overhead movements and lying on the shoulder. Evaluation to date: none. Therapy to date includes: nothing specific.---- pt had surgery on this shoulder several years ago.  Pt did start weight lifting.    The following portions of the patient's history were reviewed and updated as appropriate:  She  has a past medical history of Allergy. She  does not have any pertinent problems on file. She  has past surgical history that includes Cholecystectomy; Shoulder surgery; and Cesarean section. Her family history includes Alzheimer's disease in an other family member; Diabetes in an other family member; Hypertension in an other family member. She  reports that she has never smoked. She has never used smokeless tobacco. She reports that she does not drink alcohol or use illicit drugs. She has a current medication list which includes the following prescription(s): chlorpheniramine, ibuprofen, microgestin fe 1/20, and oxycodone-acetaminophen. Current Outpatient Prescriptions on File Prior to Visit  Medication Sig Dispense Refill  . chlorpheniramine (EQ CHLORTABS) 4 MG tablet Take 4 mg by mouth 2 (two) times daily as needed for allergies.    Marland Kitchen ibuprofen (ADVIL,MOTRIN) 600 MG tablet Take 1 tablet (600 mg total) by mouth every 6 (six) hours as needed for pain. 30 tablet 0  . MICROGESTIN FE 1/20 1-20 MG-MCG tablet Take 1 tablet by mouth daily.    Marland Kitchen oxyCODONE-acetaminophen (PERCOCET/ROXICET) 5-325 MG per tablet Take 1-2 tablets by mouth every 4-6 hours as needed for moderate to severe pain. 15 tablet 0   No current facility-administered medications on file prior to visit.   She has No Known Allergies..  Review of  Systems Pertinent items are noted in HPI.   Objective:    BP 112/78 mmHg  Pulse 77  Temp(Src) 99.5 F (37.5 C) (Oral)  Resp 16  Ht 5\' 1"  (1.549 m)  Wt 188 lb 11.2 oz (85.594 kg)  BMI 35.67 kg/m2  SpO2 98%  LMP 03/11/2015 Right shoulder: tenderness over bicep tendon,  pain with int rot and flexion of arm  Left shoulder: normal active ROM, no tenderness, no impingement sign     Assessment:    Right shoulder pain    Plan:    Educational material distributed. Reduction in offending activity. Gentle ROM exercises. Rest, ice, compression, and elevation (RICE) therapy. sport med referral   Offered pt a sling -- she refused

## 2015-04-01 ENCOUNTER — Encounter: Payer: Self-pay | Admitting: Family Medicine

## 2015-04-01 ENCOUNTER — Ambulatory Visit (INDEPENDENT_AMBULATORY_CARE_PROVIDER_SITE_OTHER): Payer: Managed Care, Other (non HMO) | Admitting: Family Medicine

## 2015-04-01 VITALS — BP 119/82 | HR 85 | Ht 60.0 in | Wt 183.0 lb

## 2015-04-01 DIAGNOSIS — M25511 Pain in right shoulder: Secondary | ICD-10-CM

## 2015-04-01 MED ORDER — DICLOFENAC SODIUM 75 MG PO TBEC
75.0000 mg | DELAYED_RELEASE_TABLET | Freq: Two times a day (BID) | ORAL | Status: DC
Start: 2015-04-01 — End: 2016-12-23

## 2015-04-01 NOTE — Patient Instructions (Signed)
You have rotator cuff impingement Try to avoid painful activities (overhead activities, lifting with extended arm) as much as possible. Diclofenac 75mg  twice a day with food for pain and inflammation. Can take tylenol in addition to this. Subacromial injection may be beneficial to help with pain and to decrease inflammation. Consider physical therapy with transition to home exercise program. Do home exercise program with theraband and scapular stabilization exercises daily - these are very important for long term relief even if an injection was given.  3 sets of 10 once a day only. If not improving at follow-up we will consider further imaging, injection, physical therapy, and/or nitro patches. Follow up with me in 6 weeks.

## 2015-04-01 NOTE — Assessment & Plan Note (Signed)
2/2 rotator cuff impingement.  Shown home exercise program to do daily.  Diclofenac twice a day with food.  Declined PT, injection at this time.  Nitro patches another consideration if not improving.  F/u in 6 weeks.

## 2015-04-01 NOTE — Progress Notes (Signed)
PCP and referred by: Garnet Koyanagi, DO  Subjective:   HPI: Patient is a 39 y.o. female here for right shoulder pain.  Patient reports she's had 6 months of what started as intermittent right shoulder pain. Has become more constant recently. Pain up to 5/10 especially with overhead motions and reaching behind her back. Had what she believes was a labral tear repaired in 2007 in this shoulder - no problems after this. Tried ice, heat, ibuprofen, percocet, aleve. + night pain  Past Medical History  Diagnosis Date  . Allergy     Current Outpatient Prescriptions on File Prior to Visit  Medication Sig Dispense Refill  . chlorpheniramine (EQ CHLORTABS) 4 MG tablet Take 4 mg by mouth 2 (two) times daily as needed for allergies.    Marland Kitchen MICROGESTIN FE 1/20 1-20 MG-MCG tablet Take 1 tablet by mouth daily.     No current facility-administered medications on file prior to visit.    Past Surgical History  Procedure Laterality Date  . Cholecystectomy    . Shoulder surgery      Right  . Cesarean section      No Known Allergies  History   Social History  . Marital Status: Single    Spouse Name: N/A  . Number of Children: N/A  . Years of Education: N/A   Occupational History  . Not on file.   Social History Main Topics  . Smoking status: Never Smoker   . Smokeless tobacco: Never Used  . Alcohol Use: No  . Drug Use: No  . Sexual Activity: Not on file   Other Topics Concern  . Not on file   Social History Narrative    Family History  Problem Relation Age of Onset  . Diabetes    . Hypertension    . Alzheimer's disease      BP 119/82 mmHg  Pulse 85  Ht 5' (1.524 m)  Wt 183 lb (83.008 kg)  BMI 35.74 kg/m2  LMP 03/11/2015  Review of Systems: See HPI above.    Objective:  Physical Exam:  Gen: NAD  Right shoulder: No swelling, ecchymoses.  No gross deformity. No TTP. FROM with painful arc. Positive Hawkins, Neers. Negative Speeds, Yergasons. Strength 5/5  with empty can and resisted internal/external rotation.  Pain empty can. Negative apprehension. NV intact distally.    Assessment & Plan:  1. Right shoulder pain - 2/2 rotator cuff impingement.  Shown home exercise program to do daily.  Diclofenac twice a day with food.  Declined PT, injection at this time.  Nitro patches another consideration if not improving.  F/u in 6 weeks.

## 2015-11-21 ENCOUNTER — Ambulatory Visit (INDEPENDENT_AMBULATORY_CARE_PROVIDER_SITE_OTHER): Payer: Managed Care, Other (non HMO) | Admitting: Family Medicine

## 2015-11-21 ENCOUNTER — Encounter: Payer: Self-pay | Admitting: Family Medicine

## 2015-11-21 VITALS — BP 128/87 | HR 80 | Ht 60.0 in | Wt 180.0 lb

## 2015-11-21 DIAGNOSIS — M7701 Medial epicondylitis, right elbow: Secondary | ICD-10-CM | POA: Diagnosis not present

## 2015-11-21 DIAGNOSIS — M25521 Pain in right elbow: Secondary | ICD-10-CM

## 2015-11-21 DIAGNOSIS — M25511 Pain in right shoulder: Secondary | ICD-10-CM | POA: Diagnosis not present

## 2015-11-21 MED ORDER — NITROGLYCERIN 0.2 MG/HR TD PT24: MEDICATED_PATCH | TRANSDERMAL | Status: DC

## 2015-11-21 NOTE — Patient Instructions (Signed)
You have medial epicondylitis Try to avoid painful activities as much as possible. Ice the area 3-4 times a day for 15 minutes at a time. Tylenol or aleve as needed for pain. Sleeve for compression.  Consider wrist brace to help with motion that aggravates this. Start physical therapy. Hammer rotation exercise, wrist flexion exercise with 1 pound weight - 3 sets of 10 once a day.   Stretching - hold for 20-30 seconds and repeat 3 times. Nitro patches 1/4th patch over affected area, change daily. Follow up in 6 weeks.  You have rotator cuff impingement Try to avoid painful activities (overhead activities, lifting with extended arm) as much as possible. Aleve 2 tabs twice a day with food OR ibuprofen 3 tabs three times a day with food for pain and inflammation. Can take tylenol in addition to this. Subacromial injection may be beneficial to help with pain and to decrease inflammation. Start physical therapy with transition to home exercise program. Do home exercise program with theraband and scapular stabilization exercises daily - these are very important for long term relief even if an injection was given. 3 sets of 10 once a day. If not improving at follow-up we will consider further imaging, injection, and/or nitro patches.

## 2015-11-26 DIAGNOSIS — M7701 Medial epicondylitis, right elbow: Secondary | ICD-10-CM | POA: Insufficient documentation

## 2015-11-26 NOTE — Assessment & Plan Note (Signed)
2/2 rotator cuff impingement.  Continue with home exercises but add physical therapy.  NSAIDs as needed.  Consider injection, nitro patches, imaging if not improving.  F/u in 6 weeks.

## 2015-11-26 NOTE — Progress Notes (Signed)
PCP and referred by: Garnet Koyanagi, DO  Subjective:   HPI: Patient is a 40 y.o. female here for right shoulder pain.  04/01/15: Patient reports she's had 6 months of what started as intermittent right shoulder pain. Has become more constant recently. Pain up to 5/10 especially with overhead motions and reaching behind her back. Had what she believes was a labral tear repaired in 2007 in this shoulder - no problems after this. Tried ice, heat, ibuprofen, percocet, aleve. + night pain  11/21/15: Patient reports her right shoulder has started bothering her again - completely improved then came back about 2 months ago when she started a new job. Pain has persisted even though she's not doing that job anymore. Pain lateral and superior shoulder, worse with reaching and overhead motions. Pain can be sharp, 3/10 at rest but up to 8/10 with motions. No swelling, bruising. Medial right elbow also hurting. Same level of pain, worse holding items. Doing home exercises. Taking aleve as needed.  Past Medical History  Diagnosis Date  . Allergy     Current Outpatient Prescriptions on File Prior to Visit  Medication Sig Dispense Refill  . chlorpheniramine (EQ CHLORTABS) 4 MG tablet Take 4 mg by mouth 2 (two) times daily as needed for allergies.    Marland Kitchen diclofenac (VOLTAREN) 75 MG EC tablet Take 1 tablet (75 mg total) by mouth 2 (two) times daily. 60 tablet 1  . MICROGESTIN FE 1/20 1-20 MG-MCG tablet Take 1 tablet by mouth daily.     No current facility-administered medications on file prior to visit.    Past Surgical History  Procedure Laterality Date  . Cholecystectomy    . Shoulder surgery      Right  . Cesarean section      No Known Allergies  Social History   Social History  . Marital Status: Single    Spouse Name: N/A  . Number of Children: N/A  . Years of Education: N/A   Occupational History  . Not on file.   Social History Main Topics  . Smoking status: Never Smoker    . Smokeless tobacco: Never Used  . Alcohol Use: No  . Drug Use: No  . Sexual Activity: Not on file   Other Topics Concern  . Not on file   Social History Narrative    Family History  Problem Relation Age of Onset  . Diabetes    . Hypertension    . Alzheimer's disease      BP 128/87 mmHg  Pulse 80  Ht 5' (1.524 m)  Wt 180 lb (81.647 kg)  BMI 35.15 kg/m2  Review of Systems: See HPI above.    Objective:  Physical Exam:  Gen: NAD, comfortable in exam room.  Right shoulder: No swelling, ecchymoses.  No gross deformity. No TTP. FROM with painful arc. Positive Hawkins, Neers. Negative Speeds, Yergasons. Strength 5/5 with empty can and resisted internal/external rotation.  Pain empty can. Negative apprehension. NV intact distally.  Right elbow: No swelling, ecchymoses, deformity. Medial epicondyle tenderness.  No other tenderness. FROM with pain on wrist flexion and extension only. Collateral ligaments intact. NVI distally.    Assessment & Plan:  1. Right shoulder pain - 2/2 rotator cuff impingement.  Continue with home exercises but add physical therapy.  NSAIDs as needed.  Consider injection, nitro patches, imaging if not improving.  F/u in 6 weeks.  2. Right medial epicondylitis - shown home exercises to do daily.  Compression sleeve.  Nitro patches -  discussed risks of headache, skin irritation.  Start physical therapy.  F/u in 6 weeks.

## 2015-11-26 NOTE — Assessment & Plan Note (Signed)
shown home exercises to do daily.  Compression sleeve.  Nitro patches - discussed risks of headache, skin irritation.  Start physical therapy.  F/u in 6 weeks.

## 2016-01-02 ENCOUNTER — Ambulatory Visit: Payer: Managed Care, Other (non HMO) | Admitting: Family Medicine

## 2016-01-14 ENCOUNTER — Encounter: Payer: Self-pay | Admitting: Family Medicine

## 2016-01-14 ENCOUNTER — Telehealth: Payer: Self-pay | Admitting: *Deleted

## 2016-01-14 NOTE — Telephone Encounter (Signed)
See phone note. Sent MyChart message LM for pt to call office for triage.

## 2016-01-14 NOTE — Telephone Encounter (Signed)
Pt reports intermittent dizziness. No trouble walking, denies syncope. Denies HA, SOB, fever, and vomiting. Currently not experience symptoms. Reports she is on her period right now, normal flow, not heavier than usual. She has not taken any new medications or supplements. She is not using her nitroglycerin patches. She is drinking fluid, but reports she has not had much to drink today. She took a nap earlier and feels a little better. Advised pt to increase fluid intake, stank up slowly, and rest as much as possible today. If symptoms worsen or fail to improve, she will call office to schedule appt tomorrow morning.

## 2016-01-14 NOTE — Telephone Encounter (Signed)
Called to triage pt for MyChart message regarding dizziness. Attempted both numbers in chart, no answer. LMOM on home number. Work number on file has been disconnected. When pt calls back, please transfer to [facility] or [facility] RN for triage.

## 2016-01-15 NOTE — Telephone Encounter (Signed)
Please check on her today

## 2016-01-15 NOTE — Telephone Encounter (Signed)
LMOM for pt to return call. 

## 2016-01-19 ENCOUNTER — Encounter: Payer: Self-pay | Admitting: Family Medicine

## 2016-05-21 ENCOUNTER — Ambulatory Visit (INDEPENDENT_AMBULATORY_CARE_PROVIDER_SITE_OTHER): Payer: 59 | Admitting: Family Medicine

## 2016-05-21 ENCOUNTER — Encounter: Payer: Self-pay | Admitting: Family Medicine

## 2016-05-21 VITALS — BP 118/78 | HR 90 | Temp 98.4°F | Resp 16 | Ht 60.0 in | Wt 199.6 lb

## 2016-05-21 DIAGNOSIS — M940 Chondrocostal junction syndrome [Tietze]: Secondary | ICD-10-CM

## 2016-05-21 MED ORDER — KETOROLAC TROMETHAMINE 60 MG/2ML IM SOLN
60.0000 mg | Freq: Once | INTRAMUSCULAR | Status: AC
  Administered 2016-05-21: 60 mg via INTRAMUSCULAR

## 2016-05-21 MED ORDER — CYCLOBENZAPRINE HCL 10 MG PO TABS: 10.0000 mg | ORAL_TABLET | Freq: Three times a day (TID) | ORAL | 0 refills | Status: DC | PRN

## 2016-05-21 NOTE — Progress Notes (Signed)
Musculoskeletal Exam  Patient: Sheena Olsen DOB: November 15, 1975  DOS: 05/21/2016  SUBJECTIVE:  Chief Complaint:   Chief Complaint  Patient presents with  . Back Pain    patient c/o lower left back pain that has lasted 2 days ago now it's getting worst. patient feel like the pain has been there ever since gyn increased dosage on birthcontrol.    Sheena Olsen is a 40 y.o.  female for evaluation and treatment of her back pain.   Onset:  2 days ago.  sudden.  Location: lower left back Character:  throbbing  Progression of issue:  is unchanged Associated symptoms: None Denies bowel/bladder incontinence or weakness Treatment: to date has been: left over percocet and Voltaren for shoulder that has been unhelpful for back.   Neurovascular symptoms: no  ROS: Gastrointestinal: no bowel incontinence Genitourinary: No bladder incontinence Musculoskeletal/Extremities: +back pain Neurologic: no numbness, tingling no weakness   Past Medical History:  Diagnosis Date  . Allergy    Past Surgical History:  Procedure Laterality Date  . CESAREAN SECTION    . CHOLECYSTECTOMY    . SHOULDER SURGERY     Right   Family History  Problem Relation Age of Onset  . Diabetes    . Hypertension    . Alzheimer's disease     Current Outpatient Prescriptions  Medication Sig Dispense Refill  . chlorpheniramine (EQ CHLORTABS) 4 MG tablet Take 4 mg by mouth 2 (two) times daily as needed for allergies.    Marland Kitchen diclofenac (VOLTAREN) 75 MG EC tablet Take 1 tablet (75 mg total) by mouth 2 (two) times daily. 60 tablet 1  . MICROGESTIN FE 1/20 1-20 MG-MCG tablet Take 1 tablet by mouth daily.    . nitroGLYCERIN (NITRODUR - DOSED IN MG/24 HR) 0.2 mg/hr patch Apply 1/4th patch to affected elbow, change daily 30 patch 1   No Known Allergies Social History   Social History  . Marital status: Single   Social History Main Topics  . Smoking status: Never Smoker  . Smokeless tobacco: Never Used  . Alcohol  use No  . Drug use: No   Objective:  VITAL SIGNS: BP 118/78 (BP Location: Right Arm, Patient Position: Sitting, Cuff Size: Normal)   Pulse 90   Temp 98.4 F (36.9 C) (Oral)   Resp 16   Ht 5' (1.524 m)   Wt 199 lb 9.6 oz (90.5 kg)   LMP 05/07/2016 (Exact Date)   SpO2 98%   BMI 38.98 kg/m  Constitutional: Well formed, well developed. No acute distress. HENT: Normocephalic, atraumatic.  Moist mucous membranes.  Eyes:  EOM grossly intact. Conjunctiva normal.  Neck:  Full range of motion.   Cardiovascular: RRR, no murmurs Thorax & Lungs:  CTAB, no wheezing or rales  Extremities: No clubbing. No cyanosis. No edema.  Skin: Warm. Dry. No erythema. No rash.  Musculoskeletal: low back.   Normal active range of motion: no.   Normal passive range of motion: no Tenderness to palpation: yes - over L posterior rib angle of R 8 Deformity: no Ecchymosis: no Straight leg test: negative Neurologic: Normal sensory function. No focal deficits noted. DTR's equal and symmetry in LE's. No clonus. Psychiatric: Normal mood. Age appropriate judgment and insight. Alert & oriented x 3.    Assessment:  Costochondritis - Plan: cyclobenzaprine (FLEXERIL) 10 MG tablet, ketorolac (TORADOL) injection 60 mg  Plan: Orders as above. Continue taking Voltaren with Flexeril. Take at night and cut in half if she gets too drowsy.  Gave stretches/exercises.  F/u if symptoms worsen or fail to improve. Would consider PT vs dedicated OMM exam. The patient voiced understanding and agreement to the plan.   Crosby Oyster [place]

## 2016-05-21 NOTE — Patient Instructions (Addendum)
Take your medicine at night first. If you get drowsy, try taking half a tab instead. EXERCISES  RANGE OF MOTION (ROM) AND STRETCHING EXERCISES - Low Back Sprain Most people with lower back pain will find that their symptoms get worse with excessive bending forward (flexion) or arching at the lower back (extension). The exercises that will help resolve your symptoms will focus on the opposite motion.  Your physician, physical therapist or athletic trainer will help you determine which exercises will be most helpful to resolve your lower back pain. Do not complete any exercises without first consulting with your caregiver. Discontinue any exercises which make your symptoms worse, until you speak to your caregiver. If you have pain, numbness or tingling which travels down into your buttocks, leg or foot, the goal of the therapy is for these symptoms to move closer to your back and eventually resolve. Sometimes, these leg symptoms will get better, but your lower back pain may worsen. This is often an indication of progress in your rehabilitation. Be very alert to any changes in your symptoms and the activities in which you participated in the 24 hours prior to the change. Sharing this information with your caregiver will allow him or her to most efficiently treat your condition. These exercises may help you when beginning to rehabilitate your injury. Your symptoms may resolve with or without further involvement from your physician, physical therapist or athletic trainer. While completing these exercises, remember:   Restoring tissue flexibility helps normal motion to return to the joints. This allows healthier, less painful movement and activity.  An effective stretch should be held for at least 30 seconds.  A stretch should never be painful. You should only feel a gentle lengthening or release in the stretched tissue. FLEXION RANGE OF MOTION AND STRETCHING EXERCISES: STRETCH - Flexion, Single Knee to  Chest   Lie on a firm bed or floor with both legs extended in front of you.  Keeping one leg in contact with the floor, bring your opposite knee to your chest. Hold your leg in place by either grabbing behind your thigh or at your knee.  Pull until you feel a gentle stretch in your low back. Hold __________ seconds.  Slowly release your grasp and repeat the exercise with the opposite side. Repeat __________ times. Complete this exercise __________ times per day.  STRETCH - Flexion, Double Knee to Chest  Lie on a firm bed or floor with both legs extended in front of you.  Keeping one leg in contact with the floor, bring your opposite knee to your chest.  Tense your stomach muscles to support your back and then lift your other knee to your chest. Hold your legs in place by either grabbing behind your thighs or at your knees.  Pull both knees toward your chest until you feel a gentle stretch in your low back. Hold __________ seconds.  Tense your stomach muscles and slowly return one leg at a time to the floor. Repeat __________ times. Complete this exercise __________ times per day.  STRETCH - Low Trunk Rotation  Lie on a firm bed or floor. Keeping your legs in front of you, bend your knees so they are both pointed toward the ceiling and your feet are flat on the floor.  Extend your arms out to the side. This will stabilize your upper body by keeping your shoulders in contact with the floor.  Gently and slowly drop both knees together to one side until you feel a  gentle stretch in your low back. Hold for __________ seconds.  Tense your stomach muscles to support your lower back as you bring your knees back to the starting position. Repeat the exercise to the other side. Repeat __________ times. Complete this exercise __________ times per day  EXTENSION RANGE OF MOTION AND FLEXIBILITY EXERCISES: STRETCH - Extension, Prone on Elbows   Lie on your stomach on the floor, a bed will be too  soft. Place your palms about shoulder width apart and at the height of your head.  Place your elbows under your shoulders. If this is too painful, stack pillows under your chest.  Allow your body to relax so that your hips drop lower and make contact more completely with the floor.  Hold this position for __________ seconds.  Slowly return to lying flat on the floor. Repeat __________ times. Complete this exercise __________ times per day.  RANGE OF MOTION - Extension, Prone Press Ups  Lie on your stomach on the floor, a bed will be too soft. Place your palms about shoulder width apart and at the height of your head.  Keeping your back as relaxed as possible, slowly straighten your elbows while keeping your hips on the floor. You may adjust the placement of your hands to maximize your comfort. As you gain motion, your hands will come more underneath your shoulders.  Hold this position __________ seconds.  Slowly return to lying flat on the floor. Repeat __________ times. Complete this exercise __________ times per day.  RANGE OF MOTION- Quadruped, Neutral Spine   Assume a hands and knees position on a firm surface. Keep your hands under your shoulders and your knees under your hips. You may place padding under your knees for comfort.  Drop your head and point your tailbone toward the ground below you. This will round out your lower back like an angry cat. Hold this position for __________ seconds.  Slowly lift your head and release your tail bone so that your back sags into a large arch, like an old horse.  Hold this position for __________ seconds.  Repeat this until you feel limber in your low back.  Now, find your "sweet spot." This will be the most comfortable position somewhere between the two previous positions. This is your neutral spine. Once you have found this position, tense your stomach muscles to support your low back.  Hold this position for __________ seconds. Repeat  __________ times. Complete this exercise __________ times per day.  STRENGTHENING EXERCISES - Low Back Sprain These exercises may help you when beginning to rehabilitate your injury. These exercises should be done near your "sweet spot." This is the neutral, low-back arch, somewhere between fully rounded and fully arched, that is your least painful position. When performed in this safe range of motion, these exercises can be used for people who have either a flexion or extension based injury. These exercises may resolve your symptoms with or without further involvement from your physician, physical therapist or athletic trainer. While completing these exercises, remember:   Muscles can gain both the endurance and the strength needed for everyday activities through controlled exercises.  Complete these exercises as instructed by your physician, physical therapist or athletic trainer. Increase the resistance and repetitions only as guided.  You may experience muscle soreness or fatigue, but the pain or discomfort you are trying to eliminate should never worsen during these exercises. If this pain does worsen, stop and make certain you are following the directions exactly.  If the pain is still present after adjustments, discontinue the exercise until you can discuss the trouble with your caregiver. STRENGTHENING - Deep Abdominals, Pelvic Tilt   Lie on a firm bed or floor. Keeping your legs in front of you, bend your knees so they are both pointed toward the ceiling and your feet are flat on the floor.  Tense your lower abdominal muscles to press your low back into the floor. This motion will rotate your pelvis so that your tail bone is scooping upwards rather than pointing at your feet or into the floor. With a gentle tension and even breathing, hold this position for __________ seconds. Repeat __________ times. Complete this exercise __________ times per day.  STRENGTHENING - Abdominals, Crunches    Lie on a firm bed or floor. Keeping your legs in front of you, bend your knees so they are both pointed toward the ceiling and your feet are flat on the floor. Cross your arms over your chest.  Slightly tip your chin down without bending your neck.  Tense your abdominals and slowly lift your trunk high enough to just clear your shoulder blades. Lifting higher can put excessive stress on the lower back and does not further strengthen your abdominal muscles.  Control your return to the starting position. Repeat __________ times. Complete this exercise __________ times per day.  STRENGTHENING - Quadruped, Opposite UE/LE Lift   Assume a hands and knees position on a firm surface. Keep your hands under your shoulders and your knees under your hips. You may place padding under your knees for comfort.  Find your neutral spine and gently tense your abdominal muscles so that you can maintain this position. Your shoulders and hips should form a rectangle that is parallel with the floor and is not twisted.  Keeping your trunk steady, lift your right hand no higher than your shoulder and then your left leg no higher than your hip. Make sure you are not holding your breath. Hold this position for __________ seconds.  Continuing to keep your abdominal muscles tense and your back steady, slowly return to your starting position. Repeat with the opposite arm and leg. Repeat __________ times. Complete this exercise __________ times per day.  STRENGTHENING - Abdominals and Quadriceps, Straight Leg Raise   Lie on a firm bed or floor with both legs extended in front of you.  Keeping one leg in contact with the floor, bend the other knee so that your foot can rest flat on the floor.  Find your neutral spine, and tense your abdominal muscles to maintain your spinal position throughout the exercise.  Slowly lift your straight leg off the floor about 6 inches for a count of 15, making sure to not hold your  breath.  Still keeping your neutral spine, slowly lower your leg all the way to the floor. Repeat this exercise with each leg __________ times. Complete this exercise __________ times per day. POSTURE AND BODY MECHANICS CONSIDERATIONS - Low Back Sprain Keeping correct posture when sitting, standing or completing your activities will reduce the stress put on different body tissues, allowing injured tissues a chance to heal and limiting painful experiences. The following are general guidelines for improved posture. Your physician or physical therapist will provide you with any instructions specific to your needs. While reading these guidelines, remember:  The exercises prescribed by your provider will help you have the flexibility and strength to maintain correct postures.  The correct posture provides the best environment for your  joints to work. All of your joints have less wear and tear when properly supported by a spine with good posture. This means you will experience a healthier, less painful body.  Correct posture must be practiced with all of your activities, especially prolonged sitting and standing. Correct posture is as important when doing repetitive low-stress activities (typing) as it is when doing a single heavy-load activity (lifting). RESTING POSITIONS Consider which positions are most painful for you when choosing a resting position. If you have pain with flexion-based activities (sitting, bending, stooping, squatting), choose a position that allows you to rest in a less flexed posture. You would want to avoid curling into a fetal position on your side. If your pain worsens with extension-based activities (prolonged standing, working overhead), avoid resting in an extended position such as sleeping on your stomach. Most people will find more comfort when they rest with their spine in a more neutral position, neither too rounded nor too arched. Lying on a non-sagging bed on your side with  a pillow between your knees, or on your back with a pillow under your knees will often provide some relief. Keep in mind, being in any one position for a prolonged period of time, no matter how correct your posture, can still lead to stiffness. PROPER SITTING POSTURE In order to minimize stress and discomfort on your spine, you must sit with correct posture. Sitting with good posture should be effortless for a healthy body. Returning to good posture is a gradual process. Many people can work toward this most comfortably by using various supports until they have the flexibility and strength to maintain this posture on their own. When sitting with proper posture, your ears will fall over your shoulders and your shoulders will fall over your hips. You should use the back of the chair to support your upper back. Your lower back will be in a neutral position, just slightly arched. You may place a small pillow or folded towel at the base of your lower back for  support.  When working at a desk, create an environment that supports good, upright posture. Without extra support, muscles tire, which leads to excessive strain on joints and other tissues. Keep these recommendations in mind: CHAIR:  A chair should be able to slide under your desk when your back makes contact with the back of the chair. This allows you to work closely.  The chair's height should allow your eyes to be level with the upper part of your monitor and your hands to be slightly lower than your elbows. BODY POSITION  Your feet should make contact with the floor. If this is not possible, use a foot rest.  Keep your ears over your shoulders. This will reduce stress on your neck and low back. INCORRECT SITTING POSTURES  If you are feeling tired and unable to assume a healthy sitting posture, do not slouch or slump. This puts excessive strain on your back tissues, causing more damage and pain. Healthier options include:  Using more support,  like a lumbar pillow.  Switching tasks to something that requires you to be upright or walking.  Talking a brief walk.  Lying down to rest in a neutral-spine position. PROLONGED STANDING WHILE SLIGHTLY LEANING FORWARD  When completing a task that requires you to lean forward while standing in one place for a long time, place either foot up on a stationary 2-4 inch high object to help maintain the best posture. When both feet are on the  ground, the lower back tends to lose its slight inward curve. If this curve flattens (or becomes too large), then the back and your other joints will experience too much stress, tire more quickly, and can cause pain. CORRECT STANDING POSTURES Proper standing posture should be assumed with all daily activities, even if they only take a few moments, like when brushing your teeth. As in sitting, your ears should fall over your shoulders and your shoulders should fall over your hips. You should keep a slight tension in your abdominal muscles to brace your spine. Your tailbone should point down to the ground, not behind your body, resulting in an over-extended swayback posture.  INCORRECT STANDING POSTURES  Common incorrect standing postures include a forward head, locked knees and/or an excessive swayback. WALKING Walk with an upright posture. Your ears, shoulders and hips should all line-up. PROLONGED ACTIVITY IN A FLEXED POSITION When completing a task that requires you to bend forward at your waist or lean over a low surface, try to find a way to stabilize 3 out of 4 of your limbs. You can place a hand or elbow on your thigh or rest a knee on the surface you are reaching across. This will provide you more stability, so that your muscles do not tire as quickly. By keeping your knees relaxed, or slightly bent, you will also reduce stress across your lower back. CORRECT LIFTING TECHNIQUES DO :  Assume a wide stance. This will provide you more stability and the  opportunity to get as close as possible to the object which you are lifting.  Tense your abdominals to brace your spine. Bend at the knees and hips. Keeping your back locked in a neutral-spine position, lift using your leg muscles. Lift with your legs, keeping your back straight.  Test the weight of unknown objects before attempting to lift them.  Try to keep your elbows locked down at your sides in order get the best strength from your shoulders when carrying an object.  Always ask for help when lifting heavy or awkward objects. INCORRECT LIFTING TECHNIQUES DO NOT:   Lock your knees when lifting, even if it is a small object.  Bend and twist. Pivot at your feet or move your feet when needing to change directions.  Assume that you can safely pick up even a paperclip without proper posture.   This information is not intended to replace advice given to you by your health care provider. Make sure you discuss any questions you have with your health care provider.   Document Released: 09/13/2005 Document Revised: 10/04/2014 Document Reviewed: 12/26/2008 [employer] Interactive Patient Education [employer].

## 2016-05-21 NOTE — Progress Notes (Signed)
Pre visit review using our clinic review tool, if applicable. No additional management support is needed unless otherwise documented below in the visit note. 

## 2016-08-31 ENCOUNTER — Ambulatory Visit (INDEPENDENT_AMBULATORY_CARE_PROVIDER_SITE_OTHER): Payer: 59 | Admitting: Neurology

## 2016-08-31 ENCOUNTER — Encounter: Payer: Self-pay | Admitting: Neurology

## 2016-08-31 VITALS — BP 140/100 | HR 83 | Ht 60.0 in | Wt 200.8 lb

## 2016-08-31 DIAGNOSIS — G43829 Menstrual migraine, not intractable, without status migrainosus: Secondary | ICD-10-CM

## 2016-08-31 DIAGNOSIS — H8110 Benign paroxysmal vertigo, unspecified ear: Secondary | ICD-10-CM

## 2016-08-31 DIAGNOSIS — E538 Deficiency of other specified B group vitamins: Secondary | ICD-10-CM | POA: Diagnosis not present

## 2016-08-31 DIAGNOSIS — R4189 Other symptoms and signs involving cognitive functions and awareness: Secondary | ICD-10-CM

## 2016-08-31 MED ORDER — SUMATRIPTAN SUCCINATE 100 MG PO TABS: 100.0000 mg | ORAL_TABLET | Freq: Once | ORAL | 12 refills | Status: DC | PRN

## 2016-08-31 MED ORDER — NAPROXEN 500 MG PO TABS: ORAL_TABLET | ORAL | 6 refills | Status: DC

## 2016-08-31 NOTE — Patient Instructions (Addendum)
Overall you are doing fairly well but I do want to suggest a few things today:   Remember to drink plenty of fluid, eat healthy meals and do not skip any meals. Try to eat protein with a every meal and eat a healthy snack such as fruit or nuts in between meals. Try to keep a regular sleep-wake schedule and try to exercise daily, particularly in the form of walking, 20-30 minutes a day, if you can.   As far as your medications are concerned, I would like to suggest:  3 days before anticipated headache start Naproxen 500mg  twice daily for 5 days. Take with food At onset of headache take Sumatriptan, may repeat in 2 hours once, no more than 2x in one day. Will send recommendations for borth control changes to [facility] therapy for BPPV F/u 4 months  As far as diagnostic testing: B12 and folate, methylmalonic acid  Our phone number is [phone]. We also have an after hours call service for urgent matters and there is a physician on-call for urgent questions. For any emergencies you know to call 911 or go to the nearest emergency room   Benign Positional Vertigo Introduction Vertigo is the feeling that you or your surroundings are moving when they are not. Benign positional vertigo is the most common form of vertigo. The cause of this condition is not serious (is benign). This condition is triggered by certain movements and positions (is positional). This condition can be dangerous if it occurs while you are doing something that could endanger you or others, such as driving. What are the causes? In many cases, the cause of this condition is not known. It may be caused by a disturbance in an area of the inner ear that helps your brain to sense movement and balance. This disturbance can be caused by a viral infection (labyrinthitis), head injury, or repetitive motion. What increases the risk? This condition is more likely to develop in:  Women.  People who are 4 years of age or  older. What are the signs or symptoms? Symptoms of this condition usually happen when you move your head or your eyes in different directions. Symptoms may start suddenly, and they usually last for less than a minute. Symptoms may include:  Loss of balance and falling.  Feeling like you are spinning or moving.  Feeling like your surroundings are spinning or moving.  Nausea and vomiting.  Blurred vision.  Dizziness.  Involuntary eye movement (nystagmus). Symptoms can be mild and cause only slight annoyance, or they can be severe and interfere with daily life. Episodes of benign positional vertigo may return (recur) over time, and they may be triggered by certain movements. Symptoms may improve over time. How is this diagnosed? This condition is usually diagnosed by medical history and a physical exam of the head, neck, and ears. You may be referred to a health care provider who specializes in ear, nose, and throat (ENT) problems (otolaryngologist) or a provider who specializes in disorders of the nervous system (neurologist). You may have additional testing, including:  MRI.  A CT scan.  Eye movement tests. Your health care provider may ask you to change positions quickly while he or she watches you for symptoms of benign positional vertigo, such as nystagmus. Eye movement may be tested with an electronystagmogram (ENG), caloric stimulation, the Dix-Hallpike test, or the roll test.  An electroencephalogram (EEG). This records electrical activity in your brain.  Hearing tests. How is this treated? Usually, your  health care provider will treat this by moving your head in specific positions to adjust your inner ear back to normal. Surgery may be needed in severe cases, but this is rare. In some cases, benign positional vertigo may resolve on its own in 2-4 weeks. Follow these instructions at home: Safety  Move slowly.Avoid sudden body or head movements.  Avoid driving.  Avoid  operating heavy machinery.  Avoid doing any tasks that would be dangerous to you or others if a vertigo episode would occur.  If you have trouble walking or keeping your balance, try using a cane for stability. If you feel dizzy or unstable, sit down right away.  Return to your normal activities as told by your health care provider. Ask your health care provider what activities are safe for you. General instructions  Take over-the-counter and prescription medicines only as told by your health care provider.  Avoid certain positions or movements as told by your health care provider.  Drink enough fluid to keep your urine clear or pale yellow.  Keep all follow-up visits as told by your health care provider. This is important. Contact a health care provider if:  You have a fever.  Your condition gets worse or you develop new symptoms.  Your family or friends notice any behavioral changes.  Your nausea or vomiting gets worse.  You have numbness or a "pins and needles" sensation. Get help right away if:  You have difficulty speaking or moving.  You are always dizzy.  You faint.  You develop severe headaches.  You have weakness in your legs or arms.  You have changes in your hearing or vision.  You develop a stiff neck.  You develop sensitivity to light. This information is not intended to replace advice given to you by your health care provider. Make sure you discuss any questions you have with your health care provider. Document Released: 06/21/2006 Document Revised: 02/19/2016 Document Reviewed: 01/06/2015  2017 Elsevier  Sumatriptan tablets What is this medicine? SUMATRIPTAN (soo ma TRIP tan) is used to treat migraines with or without aura. An aura is a strange feeling or visual disturbance that warns you of an attack. It is not used to prevent migraines. This medicine may be used for other purposes; ask your health care provider or pharmacist if you have  questions. COMMON BRAND NAME(S): Imitrex, Migraine Pack What should I tell my health care provider before I take this medicine? They need to know if you have any of these conditions: -circulation problems in fingers and toes -diabetes -heart disease -high blood pressure -high cholesterol -history of irregular heartbeat -history of stroke -kidney disease -liver disease -postmenopausal or surgical removal of uterus and ovaries -seizures -smoke tobacco -stomach or intestine problems -an unusual or allergic reaction to sumatriptan, other medicines, foods, dyes, or preservatives -pregnant or trying to get pregnant -breast-feeding How should I use this medicine? Take this medicine by mouth with a glass of water. Follow the directions on the prescription label. This medicine is taken at the first symptoms of a migraine. It is not for everyday use. If your migraine headache returns after one dose, you can take another dose as directed. You must leave at least 2 hours between doses, and do not take more than 100 mg as a single dose. Do not take more than 200 mg total in any 24 hour period. If there is no improvement at all after the first dose, do not take a second dose without talking to your  doctor or health care professional. Do not take your medicine more often than directed. Talk to your pediatrician regarding the use of this medicine in children. Special care may be needed. Overdosage: If you think you have taken too much of this medicine contact a poison control center or emergency room at once. NOTE: This medicine is only for you. Do not share this medicine with others. What if I miss a dose? This does not apply; this medicine is not for regular use. What may interact with this medicine? Do not take this medicine with any of the following medicines: -cocaine -ergot alkaloids like dihydroergotamine, ergonovine, ergotamine, methylergonovine -feverfew -MAOIs like Carbex, Eldepryl,  Marplan, Nardil, and Parnate -other medicines for migraine headache like almotriptan, eletriptan, frovatriptan, naratriptan, rizatriptan, zolmitriptan -tryptophan This medicine may also interact with the following medications: -certain medicines for depression, anxiety, or psychotic disturbances This list may not describe all possible interactions. Give your health care provider a list of all the medicines, herbs, non-prescription drugs, or dietary supplements you use. Also tell them if you smoke, drink alcohol, or use illegal drugs. Some items may interact with your medicine. What should I watch for while using this medicine? Only take this medicine for a migraine headache. Take it if you get warning symptoms or at the start of a migraine attack. It is not for regular use to prevent migraine attacks. You may get drowsy or dizzy. Do not drive, use machinery, or do anything that needs mental alertness until you know how this medicine affects you. To reduce dizzy or fainting spells, do not sit or stand up quickly, especially if you are an older patient. Alcohol can increase drowsiness, dizziness and flushing. Avoid alcoholic drinks. Smoking cigarettes may increase the risk of heart-related side effects from using this medicine. If you take migraine medicines for 10 or more days a month, your migraines may get worse. Keep a diary of headache days and medicine use. Contact your healthcare professional if your migraine attacks occur more frequently. What side effects may I notice from receiving this medicine? Side effects that you should report to your doctor or health care professional as soon as possible: -allergic reactions like skin rash, itching or hives, swelling of the face, lips, or tongue -bloody or watery diarrhea -hallucination, loss of contact with reality -pain, tingling, numbness in the face, hands, or feet -seizures -signs and symptoms of a blood clot such as breathing problems; changes  in vision; chest pain; severe, sudden headache; pain, swelling, warmth in the leg; trouble speaking; sudden numbness or weakness of the face, arm, or leg -signs and symptoms of a dangerous change in heartbeat or heart rhythm like chest pain; dizziness; fast or irregular heartbeat; palpitations, feeling faint or lightheaded; falls; breathing problems -signs and symptoms of a stroke like changes in vision; confusion; trouble speaking or understanding; severe headaches; sudden numbness or weakness of the face, arm, or leg; trouble walking; dizziness; loss of balance or coordination -stomach pain Side effects that usually do not require medical attention (report to your doctor or health care professional if they continue or are bothersome): -changes in taste -facial flushing -headache -muscle cramps -muscle pain -nausea, vomiting -weak or tired This list may not describe all possible side effects. Call your doctor for medical advice about side effects. You may report side effects to FDA at 1-800-FDA-1088. Where should I keep my medicine? Keep out of the reach of children. Store at room temperature between 2 and 30 degrees C (36 and 86  degrees F). Throw away any unused medicine after the expiration date. NOTE: This sheet is a summary. It may not cover all possible information. If you have questions about this medicine, talk to your doctor, pharmacist, or health care provider.  2017 Elsevier/Gold Standard (2015-10-16 12:38:23)  Naproxen and naproxen sodium oral immediate-release tablets What is this medicine? NAPROXEN (na PROX en) is a non-steroidal anti-inflammatory drug (NSAID). It is used to reduce swelling and to treat pain. This medicine may be used for dental pain, headache, or painful monthly periods. It is also used for painful joint and muscular problems such as arthritis, tendinitis, bursitis, and gout. This medicine may be used for other purposes; ask your health care provider or  pharmacist if you have questions. COMMON BRAND NAME(S): Aflaxen, Aleve, Aleve Arthritis, All Day Relief, Anaprox, Anaprox DS, Naprosyn, [facility] Naproxen Sodium What should I tell my health care provider before I take this medicine? They need to know if you have any of these conditions: -asthma -cigarette smoker -drink more than 3 alcohol containing drinks a day -heart disease or circulation problems such as heart failure or leg edema (fluid retention) -high blood pressure -kidney disease -liver disease -stomach bleeding or ulcers -an unusual or allergic reaction to naproxen, aspirin, other NSAIDs, other medicines, foods, dyes, or preservatives -pregnant or trying to get pregnant -breast-feeding How should I use this medicine? Take this medicine by mouth with a glass of water. Follow the directions on the prescription label. Take it with food if your stomach gets upset. Try to not lie down for at least 10 minutes after you take it. Take your medicine at regular intervals. Do not take your medicine more often than directed. Long-term, continuous use may increase the risk of heart attack or stroke. A special MedGuide will be given to you by the pharmacist with each prescription and refill. Be sure to read this information carefully each time. Talk to your pediatrician regarding the use of this medicine in children. Special care may be needed. Overdosage: If you think you have taken too much of this medicine contact a poison control center or emergency room at once. NOTE: This medicine is only for you. Do not share this medicine with others. What if I miss a dose? If you miss a dose, take it as soon as you can. If it is almost time for your next dose, take only that dose. Do not take double or extra doses. What may interact with this medicine? -alcohol -aspirin -cidofovir -diuretics -lithium -methotrexate -other drugs for inflammation like ketorolac or  prednisone -pemetrexed -probenecid -warfarin This list may not describe all possible interactions. Give your health care provider a list of all the medicines, herbs, non-prescription drugs, or dietary supplements you use. Also tell them if you smoke, drink alcohol, or use illegal drugs. Some items may interact with your medicine. What should I watch for while using this medicine? Tell your doctor or health care professional if your pain does not get better. Talk to your doctor before taking another medicine for pain. Do not treat yourself. This medicine does not prevent heart attack or stroke. In fact, this medicine may increase the chance of a heart attack or stroke. The chance may increase with longer use of this medicine and in people who have heart disease. If you take aspirin to prevent heart attack or stroke, talk with your doctor or health care professional. Do not take other medicines that contain aspirin, ibuprofen, or naproxen with this medicine. Side effects such  as stomach upset, nausea, or ulcers may be more likely to occur. Many medicines available without a prescription should not be taken with this medicine. This medicine can cause ulcers and bleeding in the stomach and intestines at any time during treatment. Do not smoke cigarettes or drink alcohol. These increase irritation to your stomach and can make it more susceptible to damage from this medicine. Ulcers and bleeding can happen without warning symptoms and can cause death. You may get drowsy or dizzy. Do not drive, use machinery, or do anything that needs mental alertness until you know how this medicine affects you. Do not stand or sit up quickly, especially if you are an older patient. This reduces the risk of dizzy or fainting spells. This medicine can cause you to bleed more easily. Try to avoid damage to your teeth and gums when you brush or floss your teeth. What side effects may I notice from receiving this medicine? Side  effects that you should report to your doctor or health care professional as soon as possible: -black or bloody stools, blood in the urine or vomit -blurred vision -chest pain -difficulty breathing or wheezing -nausea or vomiting -severe stomach pain -skin rash, skin redness, blistering or peeling skin, hives, or itching -slurred speech or weakness on one side of the body -swelling of eyelids, throat, lips -unexplained weight gain or swelling -unusually weak or tired -yellowing of eyes or skin Side effects that usually do not require medical attention (report to your doctor or health care professional if they continue or are bothersome): -constipation -headache -heartburn This list may not describe all possible side effects. Call your doctor for medical advice about side effects. You may report side effects to FDA at 1-800-FDA-1088. Where should I keep my medicine? Keep out of the reach of children. Store at room temperature between 15 and 30 degrees C (59 and 86 degrees F). Keep container tightly closed. Throw away any unused medicine after the expiration date. NOTE: This sheet is a summary. It may not cover all possible information. If you have questions about this medicine, talk to your doctor, pharmacist, or health care provider.  2017 Elsevier/Gold Standard (2009-09-15 20:10:16)

## 2016-08-31 NOTE — Progress Notes (Signed)
[facility]    Provider:  Dr Jaynee Eagles Referring Provider: Thurnell Lose MD Primary Care Physician:   Thurnell Lose MD   CC:  Headaches  HPI:  Sheena Olsen is a 40 y.o. female here as a referral from Dr. Carollee Herter for headaches. PMHx of migraines that started as a teenager. She has headaches at the end of pill packet the 3rd week, last 3 days of pills with hormones before starting the the placebo pills. They are always at that time. She has one headache a day for 3 days. No other headaches during the month.  She has had the headaches for 6 months. They are in the temples and feels like her eyes are going to pop out of there head.Throbbing. She has light sensitivity when headache is severe but no dizziness with the headaches, very sound sensitive, she needs a quiet dark room and it helps. Headaches can last all day unless she takes Excedrin it goes away. But she has to take Excedrin three days in a row. It goes away in an hour and stays away for the most part. No inciting events. No nausea or vomiting. It can be severe where she lays in bed all day. She feels like she has memory loss, she doesn't remember things as well as she did. Unknown if she snores. She wakes up with headaches once a month. She works two jobs so unclear if excessively tired, denies nodding off. Dizziness has happened. The room spins always associated with head movement and happens in the morning. Happens 1-2x a month.No aura. Dizziness is always when turning head to the left.   Reviewed notes, labs and imaging from outside physicians, which showed:   BUN 8 and creatinine 0.94 August 2014, TSH 1.9 02/2015  Reviewed the pcp notes. Patient having more headaches towards the end of her pill pack. Headaches get bad end of the third week, 3 pills from the placebo. Has migraines in the frontal or temporal area. Does not get nausea, no sensitivity to light, had migraines as a child, turning OCP seemed to help.  Excedrin is the only thing that helps. Patient gets dizzy when she gets the headaches, gets vertigo. Previous frequency was once a month now worsening. Was diagnosed with chronic non-intractable headache and referred here for evaluation.  Review of Systems: Patient complains of symptoms per HPI as well as the following symptoms:dizziness, no CP, no SOB . Pertinent negatives per HPI. All others negative.   Social History   Social History  . Marital status: Single    Spouse name: N/A  . Number of children: 1  . 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 Smoking status: Never Smoker  . Smokeless tobacco: Never Used  . Alcohol use 0.0 oz/week     Comment: Rare  . Drug use: No  . Sexual activity: Not on file   Other Topics Concern  . Not on file   Social History Narrative   Lives with son    Caffeine use: 1 cup coffee per day or tea    Family History  Problem Relation Age of Onset  . Diabetes    . Hypertension    . Alzheimer's disease    . Thyroid disease Mother   . Migraines Neg Hx     Past Medical History:  Diagnosis Date  . Allergy   . Migraines    Since age 61  .  Uterine fibroid     Past Surgical History:  Procedure Laterality Date  . CESAREAN SECTION    . CHOLECYSTECTOMY    . SHOULDER SURGERY Right    Right    Current Outpatient Prescriptions  Medication Sig Dispense Refill  . chlorpheniramine (EQ CHLORTABS) 4 MG tablet Take 4 mg by mouth 2 (two) times daily as needed for allergies.    . cyclobenzaprine (FLEXERIL) 10 MG tablet Take 1 tablet (10 mg total) by mouth 3 (three) times daily as needed for muscle spasms. 30 tablet 0  . diclofenac (VOLTAREN) 75 MG EC tablet Take 1 tablet (75 mg total) by mouth 2 (two) times daily. 60 tablet 1  . MICROGESTIN FE 1/20 1-20 MG-MCG tablet Take 1 tablet by mouth daily.    . naproxen (NAPROSYN) 500 MG tablet 3 days before anticipated headache start Naproxen 500mg   twice daily for 5 days. Take with food 10 tablet 6  . SUMAtriptan (IMITREX) 100 MG tablet Take 1 tablet (100 mg total) by mouth once as needed. May repeat in 2 hours if headache persists or recurs. Maximum 2 pill sin one day 10 tablet 12   No current facility-administered medications for this visit.     Allergies as of 08/31/2016 - Review Complete 08/31/2016  Allergen Reaction Noted  . Other  08/31/2016    Vitals: BP (!) 140/100 (BP Location: Right Arm, Patient Position: Sitting, Cuff Size: Large)   Pulse 83   Ht 5' (1.524 m)   Wt 200 lb 12.8 oz (91.1 kg)   BMI 39.22 kg/m  Last Weight:  Wt Readings from Last 1 Encounters:  08/31/16 200 lb 12.8 oz (91.1 kg)   Last Height:   Ht Readings from Last 1 Encounters:  08/31/16 5' (1.524 m)   Physical exam: Exam: Gen: NAD, conversant, well nourised, obese, well groomed                     CV: RRR, no MRG. No Carotid Bruits. No peripheral edema, warm, nontender Eyes: Conjunctivae clear without exudates or hemorrhage  Neuro: Detailed Neurologic Exam  Speech:    Speech is normal; fluent and spontaneous with normal comprehension.  Cognition:    The patient is oriented to person, place, and time;     recent and remote memory intact;     language fluent;     normal attention, concentration,     fund of knowledge Cranial Nerves:    The pupils are equal, round, and reactive to light. The fundi are normal and spontaneous venous pulsations are present. Visual fields are full to finger confrontation. Extraocular movements are intact. Trigeminal sensation is intact and the muscles of mastication are normal. The face is symmetric. The palate elevates in the midline. Hearing intact. Voice is normal. Shoulder shrug is normal. The tongue has normal motion without fasciculations.   Coordination:    Normal finger to nose and heel to shin. Normal rapid alternating movements.   Gait:    Heel-toe and tandem gait are normal.   Motor  Observation:    No asymmetry, no atrophy, and no involuntary movements noted. Tone:    Normal muscle tone.    Posture:    Posture is normal. normal erect    Strength:    Strength is V/V in the upper and lower limbs.      Sensation: intact to LT     Reflex Exam:  DTR's:    Deep tendon reflexes in the upper and lower extremities  are normal bilaterally.   Toes:    The toes are downgoing bilaterally.   Clonus:    Clonus is absent.       Assessment/Plan:  Patient with migraines 3 days prior to placebo pack of her birth control pills. Also with BPPV vs migraine related vertigo but sounds more like BPPV.  - We'll start naproxen several days before the onset of her monthly migraine. Hopefully this will prevent the migraines. Discussed side effects, take with food, watch for GI upset or dark stools, discussed as per AVS instructions. Stat 500mg  twice daily 3 days before onset of headache and continue for a total of 5 days - At onset of headache take Sumatriptan, may repeat in 2 hours once, no more than 2x in one day. - If this does not help, may consider trying a change in birth control. Effective COCs for MM or MRM reduce the fall in estrogen to 10ucg or less, Specific formulations include: Lybrel (365d) which is now generic as Set designer (generic w more breakthough bleeding) Lo-Seasonique 84d(regular seasonique has a drop of 63mcg from 30 to 70mcg which is too much) Lo loestrin 1/10 FE NuvaRing 64mcg, keep in 4wk, then replace. If wants to cycle, can use a 0.075mg  estrogen patch during the week it is out Keep in mind that the pill is still daily spikes of estrogen at the time pill taken NuvaRing provides the most steady, consistent estrogen levels - For BPPV will refer to vestibular therapy for epley maneuvers - She reports memory changes, not concerning for degenerative process, likely normal cognitive aging and lifestyle, recommend weight loss, exercise, good sleeping habits. Will  check B12. - follow up 3-4 months  To prevent or relieve headaches, try the following: Cool Compress. Lie down and place a cool compress on your head.  Avoid headache triggers. If certain foods or odors seem to have triggered your migraines in the past, avoid them. A headache diary might help you identify triggers.  Include physical activity in your daily routine. Try a daily walk or other moderate aerobic exercise.  Manage stress. Find healthy ways to cope with the stressors, such as delegating tasks on your to-do list.  Practice relaxation techniques. Try deep breathing, yoga, massage and visualization.  Eat regularly. Eating regularly scheduled meals and maintaining a healthy diet might help prevent headaches. Also, drink plenty of fluids.  Follow a regular sleep schedule. Sleep deprivation might contribute to headaches Consider biofeedback. With this mind-body technique, you learn to control certain bodily functions - such as muscle tension, heart rate and blood pressure - to prevent headaches or reduce headache pain.    Proceed to emergency room if you experience new or worsening symptoms or symptoms do not resolve, if you have new neurologic symptoms or if headache is severe, or for any concerning symptom.   Cc: Thurnell Lose MD  Sarina Ill, MD  [facility] [address] [place], [place] [address]  Phone [phone] Fax [phone]

## 2016-09-01 ENCOUNTER — Telehealth: Payer: Self-pay | Admitting: *Deleted

## 2016-09-01 NOTE — Telephone Encounter (Signed)
-----   Message from Melvenia Beam, MD sent at 09/01/2016  2:34 PM EST ----- b12 normal thanks

## 2016-09-01 NOTE — Telephone Encounter (Signed)
Called and spoke to pt about normal B12 per AA,MD note. She verbalized understanding.

## 2016-09-02 LAB — B12 AND FOLATE PANEL
Folate: >20 ng/mL (ref >3.0)
Vitamin B-12: 732 pg/mL (ref 232–1245)

## 2016-09-02 LAB — METHYLMALONIC ACID, SERUM: Methylmalonic Acid: 92 nmol/L (ref 0–378)

## 2016-10-20 DIAGNOSIS — M25511 Pain in right shoulder: Secondary | ICD-10-CM | POA: Diagnosis not present

## 2016-11-04 DIAGNOSIS — M25511 Pain in right shoulder: Secondary | ICD-10-CM | POA: Diagnosis not present

## 2016-11-25 DIAGNOSIS — M25511 Pain in right shoulder: Secondary | ICD-10-CM | POA: Diagnosis not present

## 2016-12-14 ENCOUNTER — Ambulatory Visit: Payer: 59 | Attending: Neurology | Admitting: Physical Therapy

## 2016-12-14 VITALS — BP 134/98

## 2016-12-14 DIAGNOSIS — R42 Dizziness and giddiness: Secondary | ICD-10-CM | POA: Diagnosis not present

## 2016-12-14 NOTE — Patient Instructions (Addendum)
Gaze Stabilization: Standing Feet Apart    Feet shoulder width apart, keeping eyes on target on wall __6__ feet away, tilt head down 15-30 and move head side to side for _60___ seconds. Repeat while moving head up and down for _60___ seconds. Do _3-5___ sessions per day. Repeat using target on pattern background.  Copyright  VHI. All rights reserved.  Gaze Stabilization: Tip Card  1.Target must remain in focus, not blurry, and appear stationary while head is in motion. 2.Perform exercises with small head movements (45 to either side of midline). 3.Increase speed of head motion so long as target is in focus. 4.If you wear eyeglasses, be sure you can see target through lens (therapist will give specific instructions for bifocal / progressive lenses). 5.These exercises may provoke dizziness or nausea. Work through these symptoms. If too dizzy, slow head movement slightly. Rest between each exercise. 6.Exercises demand concentration; avoid distractions. 7.For safety, perform standing exercises close to a counter, wall, corner, or next to someone.  Copyright  VHI. All rights reserved.   

## 2016-12-15 NOTE — Therapy (Signed)
[facility] [address] [place], [place], [address] Phone: [phone]   Fax:  [phone]  Physical Therapy Evaluation  Patient Details  Name: Sheena Olsen MRN: [id number] Date of Birth: Jan 16, 1976 Referring Provider: Dr. Sarina Ill  Encounter Date: 12/14/2016      PT End of Session - 12/15/16 1710    Visit Number 1   Number of Visits 1   Authorization Type [employer]   PT Start Time 1536   PT Stop Time 1623   PT Time Calculation (min) 47 min      Past Medical History:  Diagnosis Date  . Allergy   . Migraines    Since age 31  . Uterine fibroid     Past Surgical History:  Procedure Laterality Date  . CESAREAN SECTION    . CHOLECYSTECTOMY    . SHOULDER SURGERY Right    Right    Vitals:   12/14/16 1540  BP: (!) 134/98   BP recorded as 121/100 approx. 5" after above reading recorded       Subjective Assessment - 12/15/16 1657    Subjective Pt reports she has a mild headache and mild dizziness at time of evaluation:  denies nausea/vomiting, not always accompanied by headache (migraines);Pt reports having had the vertigo for a couple of yrs   Pertinent History Migraines ("have had them all my life")   Patient Stated Goals resolve the vertigo            [facility] PT Assessment - 12/15/16 0001      Assessment   Medical Diagnosis BPPV - unspecified laterality   Referring Provider Dr. Sarina Ill   Onset Date/Surgical Date --  approx. 2 years ago     Balance Screen   Has the patient fallen in the past 6 months No   Has the patient had a decrease in activity level because of a fear of falling?  No   Is the patient reluctant to leave their home because of a fear of falling?  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 vertigo when testing completed     Positional Testing   Dix-Hallpike Dix-Hallpike Right;Dix-Hallpike Left   Sidelying Test Sidelying Right;Sidelying Left     Dix-Hallpike Right   Dix-Hallpike Right Duration none   Dix-Hallpike Right Symptoms No nystagmus  pt reported dizziness with return to upright position     Dix-Hallpike Left   Dix-Hallpike Left Duration none   Dix-Hallpike Left Symptoms No nystagmus  pt reported dizziness with return to upright position     Sidelying Right   Sidelying Right Duration none  Sidelying Right Symptoms No nystagmus     Sidelying Left   Sidelying Left Duration none   Sidelying Left Symptoms No nystagmus                       PT Education - 12/15/16 1709    Education provided Yes   Education Details results of eval - recommend pt to follow up with PCP regarding high BP readings today during visit; instructed pt in gaze stabilization ex. for HEP   Person(s) Educated Patient   Methods Explanation;Demonstration;Handout   Comprehension Verbalized understanding;Returned demonstration                    Plan - 12/15/16 1711    Clinical Impression Statement Pt's signs and symptoms of vertigo are not consistent with BPPV at this time as no nystagmus noted with positional testing.  Pt did have slight jumping of eyes with horizontal smooth pursuits and c/o dizziness upon completion of DVA testing, indicative of VOR dysfunction.   Pt able to perform gaze stabilization exercise for only approx. 10 secs due to provocation of symptoms with this exercise.  Pt also has high BP (121/100) at time of today's eval - symptoms not consistent with BPPV at this time.   Rehab Potential Good   PT Frequency One time visit  Per pt's request    PT Duration Other (comment)  eval only - pt does not want to schedule follow up appt at this time   PT Treatment/Interventions ADLs/Self Care Home Management;Patient/family education;Vestibular   PT Next Visit Plan N/A - eval only   PT Home Exercise Plan VOR x 1 in standing   Consulted and Agree with Plan of Care Patient      Pt declines scheduling follow up appt at this time - wants to do x1 viewing exercise and "see how it goes"  Patient will benefit from skilled therapeutic intervention in order to improve the following deficits and impairments:  Dizziness  Visit Diagnosis: Dizziness and giddiness - Plan: PT plan of care cert/re-cert     Problem List Patient Active Problem List   Diagnosis Date Noted  . Medial epicondylitis of right elbow 11/26/2015  . Right shoulder pain 04/01/2015  . ANEMIA, IRON DEFICIENCY 09/30/2009  . BACK PAIN 09/02/2009  . MIGRAINE HEADACHE 01/16/2007  . INGUINAL HERNIA, HX OF 01/16/2007    Alda Lea, PT 12/15/2016, 5:24 PM  [facility] [address] [place], [place], [address] Phone: [phone]   Fax:  [phone]  Name: Sheena Olsen MRN: [id number] Date of Birth: June 02, 1976

## 2016-12-23 ENCOUNTER — Encounter: Payer: Self-pay | Admitting: Family Medicine

## 2016-12-23 ENCOUNTER — Ambulatory Visit (INDEPENDENT_AMBULATORY_CARE_PROVIDER_SITE_OTHER): Payer: 59 | Admitting: Family Medicine

## 2016-12-23 VITALS — BP 120/90 | HR 99 | Temp 98.3°F | Ht 60.0 in | Wt 194.0 lb

## 2016-12-23 DIAGNOSIS — R030 Elevated blood-pressure reading, without diagnosis of hypertension: Secondary | ICD-10-CM

## 2016-12-23 DIAGNOSIS — G43829 Menstrual migraine, not intractable, without status migrainosus: Secondary | ICD-10-CM

## 2016-12-23 MED ORDER — RIZATRIPTAN BENZOATE 10 MG PO TABS: 10.0000 mg | ORAL_TABLET | ORAL | 11 refills | Status: DC | PRN

## 2016-12-23 NOTE — Patient Instructions (Signed)
When you are at work and get a headache, take 1 Naproxen. If no improvement, may repeat after 2 hours.  Around 3 times per week, check your blood pressure 4 times per day. Twice in the morning and twice in the evening. The readings should be at least one minute apart. Write down these values and bring them to your next nurse visit/appointment.  When you check your BP, make sure you have been doing something calm/relaxing 5 minutes prior to checking. Both feet should be flat on the floor and you should be sitting. Use your left arm and make sure it is in a relaxed position (on a table), and that the cuff is at the approximate level/height of your heart.

## 2016-12-23 NOTE — Progress Notes (Signed)
Pre visit review using our clinic review tool, if applicable. No additional management support is needed unless otherwise documented below in the visit note. 

## 2016-12-23 NOTE — Progress Notes (Signed)
Chief Complaint  Patient presents with  . Hypertension    x 2 mos  . Headache    chronic-but more freq-x 6 mos    Sheena Olsen is a 41 y.o. female here for evaluation of headache.  Over the past 2-3 months, the patient has been told she has elevated blood pressure every time she goes to an appointment. She does not have a home blood pressure monitor. She does not have a family or personal history of high blood pressure. She does not take any medications routinely for blood pressure her diet is unhealthy in general. She does not exercise routinely. She denies any chest pain or shortness of breath.  The patient also has a history of menstrual migraines. She will typically have a headache for around 4 days of the month. She does take Imitrex that does help. She states that it does make her very sleepy. She will also take naproxen when she is at work. This is also helpful. Has any numbness, tingling, vision changes, or weakness.  Past Medical History:  Diagnosis Date  . Allergy   . Migraines    Since age 23  . Uterine fibroid    Family History  Problem Relation Age of Onset  . Diabetes    . Hypertension    . Alzheimer's disease    . Thyroid disease Mother   . Migraines Neg Hx    Current Meds  Medication Sig  . chlorpheniramine (EQ CHLORTABS) 4 MG tablet Take 4 mg by mouth 2 (two) times daily as needed for allergies.  Marland Kitchen MICROGESTIN FE 1/20 1-20 MG-MCG tablet Take 1 tablet by mouth daily.  . naproxen (NAPROSYN) 500 MG tablet 3 days before anticipated headache start Naproxen 500mg  twice daily for 5 days. Take with food  . [DISCONTINUED] cyclobenzaprine (FLEXERIL) 10 MG tablet Take 1 tablet (10 mg total) by mouth 3 (three) times daily as needed for muscle spasms.  . [DISCONTINUED] diclofenac (VOLTAREN) 75 MG EC tablet Take 1 tablet (75 mg total) by mouth 2 (two) times daily.  . [DISCONTINUED] SUMAtriptan (IMITREX) 100 MG tablet Take 1 tablet (100 mg total) by mouth once as needed. May  repeat in 2 hours if headache persists or recurs. Maximum 2 pill sin one day    BP 120/90 (BP Location: Left Arm, Patient Position: Sitting, Cuff Size: Large)   Pulse 99   Temp 98.3 F (36.8 C) (Oral)   Ht 5' (1.524 m)   Wt 194 lb (88 kg)   LMP 12/16/2016 (Exact Date)   SpO2 98%   BMI 37.89 kg/m  General: awake, alert, appearing stated age Eyes: PERRLA, EOMi, eras neg b/l Heart: RRR, no murmurs, no bruits, no LE edema Lungs: CTAB, no accessory muscle use Neuro: CN 2-12 intact, no cerebellar signs, DTR's equal and symmetry, no clonus MSK: 5/5 strength throughout, normal gait, no TTP over posterior cervical triangle or paraspinal cervical musculature Psych: Age appropriate judgment and insight, mood and affect normal  Elevated blood pressure reading  Menstrual migraine without status migrainosus, not intractable - Plan: rizatriptan (MAXALT) 10 MG tablet  Orders as above. Patient instructed to acquire a home blood pressure monitor and check it 3 times per week. Write down readings including both monitor and log to nurse visit in 3 weeks. Will make medication management decisions at that time. I do not believe she is having frequent enough headaches to warrant daily prophylactic medication. We'll continue the triptan class, however will try Maxalt in hopes of it  not causing drowsiness. F/u in 3 weeks. The patient voiced understanding and agreement to the plan.  Crosby Oyster [place], [place] 12:06 PM 12/23/16

## 2017-01-13 ENCOUNTER — Ambulatory Visit (INDEPENDENT_AMBULATORY_CARE_PROVIDER_SITE_OTHER): Payer: 59 | Admitting: Family Medicine

## 2017-01-13 VITALS — BP 114/81 | HR 93

## 2017-01-13 DIAGNOSIS — R030 Elevated blood-pressure reading, without diagnosis of hypertension: Secondary | ICD-10-CM

## 2017-01-13 NOTE — Progress Notes (Signed)
Pre visit review using our clinic tool,if applicable. No additional management support is needed unless otherwise documented below in the visit note. .   Patient in for BP check per order from Dr. Deloris Ping dated 12/23/16. Patient also brought in BP and Pulse readings from 4 /9/18 -01/13/17.  Patient has not taken Maxalt ordered on last OV. Has changed diet by not eating fried foods and no processed foods. Sodium intake monitored and patient states she walks daily.   Per Dr. Nani Ravens  Continue to take medication as ordered. Follow up in 4 week with Dr. Carollee Herter and bring home BP cuff to visit. Patient advised.  Appointment scheduled for 02/14/17.

## 2017-01-13 NOTE — Progress Notes (Signed)
Noted. Agree with above.  

## 2017-02-14 ENCOUNTER — Encounter: Payer: Self-pay | Admitting: Family Medicine

## 2017-02-14 ENCOUNTER — Ambulatory Visit (INDEPENDENT_AMBULATORY_CARE_PROVIDER_SITE_OTHER): Payer: 59 | Admitting: Family Medicine

## 2017-02-14 VITALS — BP 124/70 | HR 97 | Temp 98.6°F | Ht 60.0 in | Wt 193.0 lb

## 2017-02-14 DIAGNOSIS — I10 Essential (primary) hypertension: Secondary | ICD-10-CM | POA: Diagnosis not present

## 2017-02-14 NOTE — Assessment & Plan Note (Signed)
Well controlled, no changes to meds. Encouraged heart healthy diet such as the DASH diet and exercise as tolerated.  °

## 2017-02-14 NOTE — Progress Notes (Signed)
Patient ID: Sheena Olsen, female    DOB: 03/19/1976  Age: 41 y.o. MRN: [id number]    Subjective:  Subjective  HPI [name] presents for bp check.  No complaints.   Review of Systems  Constitutional: Negative for appetite change, diaphoresis, fatigue and unexpected weight change.  Eyes: Negative for pain, redness and visual disturbance.  Respiratory: Negative for cough, chest tightness, shortness of breath and wheezing.   Cardiovascular: Negative for chest pain, palpitations and leg swelling.  Endocrine: Negative for cold intolerance, heat intolerance, polydipsia, polyphagia and polyuria.  Genitourinary: Negative for difficulty urinating, dysuria and frequency.  Neurological: Negative for dizziness, light-headedness, numbness and headaches.    History Past Medical History:  Diagnosis Date  . Allergy   . Migraines    Since age 4  . Uterine fibroid     She has a past surgical history that includes Cholecystectomy; Shoulder surgery (Right); and Cesarean section.   Her family history includes Thyroid disease in her mother.She reports that she has never smoked. She has never used smokeless tobacco. She reports that she drinks alcohol. She reports that she does not use drugs.  Current Outpatient Prescriptions on File Prior to Visit  Medication Sig Dispense Refill  . chlorpheniramine (EQ CHLORTABS) 4 MG tablet Take 4 mg by mouth 2 (two) times daily as needed for allergies.    Marland Kitchen MICROGESTIN FE 1/20 1-20 MG-MCG tablet Take 1 tablet by mouth daily.    . naproxen (NAPROSYN) 500 MG tablet 3 days before anticipated headache start Naproxen 500mg  twice daily for 5 days. Take with food 10 tablet 6  . rizatriptan (MAXALT) 10 MG tablet Take 1 tablet (10 mg total) by mouth as needed for migraine. May repeat in 2 hours if needed 10 tablet 11   No current facility-administered medications on file prior to visit.      Objective:  Objective  Physical Exam  Constitutional: She is  oriented to person, place, and time. She appears well-developed and well-nourished.  HENT:  Head: Normocephalic and atraumatic.  Eyes: Conjunctivae and EOM are normal.  Neck: Normal range of motion. Neck supple. No JVD present. Carotid bruit is not present. No thyromegaly present.  Cardiovascular: Normal rate, regular rhythm and normal heart sounds.   No murmur heard. Pulmonary/Chest: Effort normal and breath sounds normal. No respiratory distress. She has no wheezes. She has no rales. She exhibits no tenderness.  Musculoskeletal: She exhibits no edema.  Neurological: She is alert and oriented to person, place, and time.  Psychiatric: She has a normal mood and affect. Her behavior is normal. Judgment and thought content normal.  Nursing note and vitals reviewed.  BP 124/70 (BP Location: Left Arm, Patient Position: Sitting, Cuff Size: Large)   Pulse 97   Temp 98.6 F (37 C) (Oral)   Ht 5' (1.524 m)   Wt 193 lb (87.5 kg)   LMP 02/10/2017 (Exact Date)   SpO2 98%   BMI 37.69 kg/m  Wt Readings from Last 3 Encounters:  02/14/17 193 lb (87.5 kg)  12/23/16 194 lb (88 kg)  08/31/16 200 lb 12.8 oz (91.1 kg)     Lab Results  Component Value Date   WBC 9.8 04/30/2013   HGB 12.6 04/30/2013   HCT 37.9 04/30/2013   PLT 255 04/30/2013   GLUCOSE 83 04/30/2013   CHOL 186 04/24/2009   TRIG 90.0 04/24/2009   HDL 48.40 04/24/2009   LDLCALC 120 (H) 04/24/2009   ALT 7 04/30/2013   AST 13  04/30/2013   NA 137 04/30/2013   K 3.6 04/30/2013   CL 103 04/30/2013   CREATININE 0.94 04/30/2013   BUN 8 04/30/2013   CO2 25 04/30/2013   TSH 1.46 04/24/2009    No results found.   Assessment & Plan:  Plan  I am having [name] maintain her MICROGESTIN FE 1/20, chlorpheniramine, naproxen, and rizatriptan.  No orders of the defined types were placed in this encounter.   Problem List Items Addressed This Visit      Unprioritized   Essential hypertension - Primary    Well controlled, no  changes to meds. Encouraged heart healthy diet such as the DASH diet and exercise as tolerated.           Follow-up: Return in about 6 months (around 08/17/2017) for annual exam, fasting.  [name], DO

## 2017-02-14 NOTE — Patient Instructions (Signed)

## 2017-03-01 ENCOUNTER — Ambulatory Visit: Payer: 59 | Admitting: Neurology

## 2017-04-25 DIAGNOSIS — Z01419 Encounter for gynecological examination (general) (routine) without abnormal findings: Secondary | ICD-10-CM | POA: Diagnosis not present

## 2017-12-02 DIAGNOSIS — N921 Excessive and frequent menstruation with irregular cycle: Secondary | ICD-10-CM | POA: Diagnosis not present

## 2017-12-02 DIAGNOSIS — D219 Benign neoplasm of connective and other soft tissue, unspecified: Secondary | ICD-10-CM | POA: Diagnosis not present

## 2017-12-28 DIAGNOSIS — M25511 Pain in right shoulder: Secondary | ICD-10-CM | POA: Diagnosis not present

## 2018-03-16 DIAGNOSIS — N921 Excessive and frequent menstruation with irregular cycle: Secondary | ICD-10-CM | POA: Diagnosis not present

## 2018-04-26 ENCOUNTER — Other Ambulatory Visit: Payer: Self-pay | Admitting: Obstetrics and Gynecology

## 2018-04-26 ENCOUNTER — Other Ambulatory Visit (HOSPITAL_COMMUNITY)
Admission: RE | Admit: 2018-04-26 | Discharge: 2018-04-26 | Disposition: A | Discharge status: Home / Self Care | Payer: 59 | Source: Ambulatory Visit | Attending: Obstetrics and Gynecology | Admitting: Obstetrics and Gynecology

## 2018-04-26 DIAGNOSIS — Z1231 Encounter for screening mammogram for malignant neoplasm of breast: Secondary | ICD-10-CM

## 2018-04-26 DIAGNOSIS — Z01411 Encounter for gynecological examination (general) (routine) with abnormal findings: Secondary | ICD-10-CM | POA: Diagnosis not present

## 2018-05-01 LAB — CYTOLOGY - PAP
DIAGNOSIS: NEGATIVE
HPV: NOT DETECTED

## 2018-05-19 ENCOUNTER — Ambulatory Visit
Admission: RE | Admit: 2018-05-19 | Discharge: 2018-05-19 | Disposition: A | Discharge status: Home / Self Care | Payer: 59 | Source: Ambulatory Visit | Attending: Obstetrics and Gynecology | Admitting: Obstetrics and Gynecology

## 2018-05-19 DIAGNOSIS — Z1231 Encounter for screening mammogram for malignant neoplasm of breast: Secondary | ICD-10-CM | POA: Diagnosis not present

## 2018-08-31 ENCOUNTER — Encounter: Payer: Self-pay | Admitting: Family Medicine

## 2018-09-04 ENCOUNTER — Encounter: Payer: Self-pay | Admitting: Family Medicine

## 2018-09-04 ENCOUNTER — Ambulatory Visit (HOSPITAL_BASED_OUTPATIENT_CLINIC_OR_DEPARTMENT_OTHER)
Admission: RE | Admit: 2018-09-04 | Discharge: 2018-09-04 | Disposition: A | Discharge status: Home / Self Care | Payer: 59 | Source: Ambulatory Visit | Attending: Family Medicine | Admitting: Family Medicine

## 2018-09-04 ENCOUNTER — Ambulatory Visit: Payer: 59 | Admitting: Family Medicine

## 2018-09-04 VITALS — BP 110/76 | HR 87 | Temp 98.3°F | Resp 16 | Ht 60.0 in | Wt 200.2 lb

## 2018-09-04 DIAGNOSIS — M25511 Pain in right shoulder: Secondary | ICD-10-CM

## 2018-09-04 DIAGNOSIS — G8929 Other chronic pain: Secondary | ICD-10-CM | POA: Diagnosis not present

## 2018-09-04 DIAGNOSIS — M79672 Pain in left foot: Secondary | ICD-10-CM | POA: Diagnosis not present

## 2018-09-04 DIAGNOSIS — Z23 Encounter for immunization: Secondary | ICD-10-CM

## 2018-09-04 MED ORDER — MELOXICAM 15 MG PO TABS: ORAL_TABLET | ORAL | 2 refills | Status: DC

## 2018-09-04 NOTE — Progress Notes (Signed)
Patient ID: [name], female    DOB: 27-Jan-1976  Age: 42 y.o. MRN: [id number]    Subjective:  Subjective  HPI Sheena Olsen presents for R foot pain --- x 3 days.  It is better now but still hurts No known injury.  It hurts worse when she first gets up and then does improve with time---  Pain is in the ball of the foor not the arch.    Review of Systems  Constitutional: Negative for appetite change, chills, diaphoresis, fatigue, fever and unexpected weight change.  HENT: Negative for congestion and hearing loss.   Eyes: Negative for pain, discharge, redness and visual disturbance.  Respiratory: Negative for cough, chest tightness, shortness of breath and wheezing.   Cardiovascular: Negative for chest pain, palpitations and leg swelling.  Gastrointestinal: Negative for abdominal pain, blood in stool, constipation, diarrhea, nausea and vomiting.  Endocrine: Negative for cold intolerance, heat intolerance, polydipsia, polyphagia and polyuria.  Genitourinary: Negative for difficulty urinating, dysuria, frequency, hematuria and urgency.  Musculoskeletal: Negative for back pain and myalgias.  Skin: Negative for rash.  Allergic/Immunologic: Negative for environmental allergies.  Neurological: Negative for dizziness, weakness, light-headedness, numbness and headaches.  Hematological: Does not bruise/bleed easily.  Psychiatric/Behavioral: Negative for suicidal ideas. The patient is not nervous/anxious.     History Past Medical History:  Diagnosis Date  . Allergy   . Migraines    Since age 63  . Uterine fibroid     She has a past surgical history that includes Cholecystectomy; Shoulder surgery (Right); and Cesarean section.   Her family history includes Alzheimer's disease in her unknown relative; Diabetes in her unknown relative; Hypertension in her unknown relative; Thyroid disease in her mother.She reports that she has never smoked. She has never used smokeless tobacco. She  reports that she drinks alcohol. She reports that she does not use drugs.  Current Outpatient Medications on File Prior to Visit  Medication Sig Dispense Refill  . chlorpheniramine (EQ CHLORTABS) 4 MG tablet Take 4 mg by mouth 2 (two) times daily as needed for allergies.    Marland Kitchen MICROGESTIN FE 1/20 1-20 MG-MCG tablet Take 1 tablet by mouth daily.     No current facility-administered medications on file prior to visit.      Objective:  Objective  Physical Exam  Constitutional: She is oriented to person, place, and time. She appears well-developed and well-nourished.  HENT:  Head: Normocephalic and atraumatic.  Eyes: Conjunctivae and EOM are normal.  Neck: Normal range of motion. Neck supple. No JVD present. Carotid bruit is not present. No thyromegaly present.  Cardiovascular: Normal rate, regular rhythm and normal heart sounds.  No murmur heard. Pulmonary/Chest: Effort normal and breath sounds normal. No respiratory distress. She has no wheezes. She has no rales. She exhibits no tenderness.  Musculoskeletal: She exhibits edema. She exhibits no tenderness.  Neurological: She is alert and oriented to person, place, and time.  Psychiatric: She has a normal mood and affect.  Nursing note and vitals reviewed.  BP 110/76 (BP Location: Right Arm, Cuff Size: Large)   Pulse 87   Temp 98.3 F (36.8 C) (Oral)   Resp 16   Ht 5' (1.524 m)   Wt 200 lb 3.2 oz (90.8 kg)   SpO2 98%   BMI 39.10 kg/m  Wt Readings from Last 3 Encounters:  09/04/18 200 lb 3.2 oz (90.8 kg)  02/14/17 193 lb (87.5 kg)  12/23/16 194 lb (88 kg)     Lab Results  Component Value Date   WBC 9.8 04/30/2013   HGB 12.6 04/30/2013   HCT 37.9 04/30/2013   PLT 255 04/30/2013   GLUCOSE 83 04/30/2013   CHOL 186 04/24/2009   TRIG 90.0 04/24/2009   HDL 48.40 04/24/2009   LDLCALC 120 (H) 04/24/2009   ALT 7 04/30/2013   AST 13 04/30/2013   NA 137 04/30/2013   K 3.6 04/30/2013   CL 103 04/30/2013   CREATININE 0.94  04/30/2013   BUN 8 04/30/2013   CO2 25 04/30/2013   TSH 1.46 04/24/2009    Mm 3d Screen Breast Bilateral  Result Date: 05/19/2018 CLINICAL DATA:  Screening. EXAM: DIGITAL SCREENING BILATERAL MAMMOGRAM WITH TOMO AND CAD COMPARISON:  None. ACR Breast Density Category b: There are scattered areas of fibroglandular density. FINDINGS: There are no findings suspicious for malignancy. Images were processed with CAD. IMPRESSION: No mammographic evidence of malignancy. A result letter of this screening mammogram will be mailed directly to the patient. RECOMMENDATION: Screening mammogram in one year. (Code:SM-B-01Y) BI-RADS CATEGORY  1: Negative. Electronically Signed   By: Lajean Manes M.D.   On: 05/19/2018 09:38     Assessment & Plan:  Plan  I have discontinued Sheena Olsen's naproxen and rizatriptan. I am also having her start on meloxicam. Additionally, I am having her maintain her MICROGESTIN FE 1/20 and chlorpheniramine.  Meds ordered this encounter  Medications  . meloxicam (MOBIC) 15 MG tablet    Sig: 1/2-1 po qd prn pain    Dispense:  30 tablet    Refill:  2    Problem List Items Addressed This Visit      Unprioritized   Right shoulder pain - Primary   Relevant Medications   meloxicam (MOBIC) 15 MG tablet   Other Relevant Orders   Ambulatory referral to Orthopedic Surgery    Other Visit Diagnoses    Left foot pain       Relevant Orders   Ambulatory referral to Orthopedic Surgery   DG Foot Complete Left    mobic for pain Ice , elevate otc not helping   Follow-up: Return if symptoms worsen or fail to improve.  [name], DO

## 2018-09-04 NOTE — Patient Instructions (Signed)
Foot Pain Many things can cause foot pain. Some common causes are:  An injury.  A sprain.  Arthritis.  Blisters.  Bunions.  Follow these instructions at home: Pay attention to any changes in your symptoms. Take these actions to help with your discomfort:  If directed, put ice on the affected area: ? Put ice in a plastic bag. ? Place a towel between your skin and the bag. ? Leave the ice on for 15-20 minutes, 3?4 times a day for 2 days.  Take over-the-counter and prescription medicines only as told by your health care provider.  Wear comfortable, supportive shoes that fit you well. Do not wear high heels.  Do not stand or walk for long periods of time.  Do not lift a lot of weight. This can put added pressure on your feet.  Do stretches to relieve foot pain and stiffness as told by your health care provider.  Rub your foot gently.  Keep your feet clean and dry.  Contact a health care provider if:  Your pain does not get better after a few days of self-care.  Your pain gets worse.  You cannot stand on your foot. Get help right away if:  Your foot is numb or tingling.  Your foot or toes are swollen.  Your foot or toes turn white or blue.  You have warmth and redness along your foot. This information is not intended to replace advice given to you by your health care provider. Make sure you discuss any questions you have with your health care provider. Document Released: 10/10/2015 Document Revised: 02/19/2016 Document Reviewed: 10/09/2014 Elsevier Interactive Patient Education  2018 Elsevier Inc.  

## 2018-09-06 ENCOUNTER — Encounter (INDEPENDENT_AMBULATORY_CARE_PROVIDER_SITE_OTHER): Payer: Self-pay | Admitting: Family Medicine

## 2018-09-06 ENCOUNTER — Ambulatory Visit (INDEPENDENT_AMBULATORY_CARE_PROVIDER_SITE_OTHER): Payer: 59 | Admitting: Family Medicine

## 2018-09-06 DIAGNOSIS — M79672 Pain in left foot: Secondary | ICD-10-CM | POA: Diagnosis not present

## 2018-09-06 DIAGNOSIS — G8929 Other chronic pain: Secondary | ICD-10-CM

## 2018-09-06 DIAGNOSIS — M25511 Pain in right shoulder: Secondary | ICD-10-CM | POA: Diagnosis not present

## 2018-09-06 NOTE — Progress Notes (Signed)
Office Visit Note   Patient: Sheena Olsen           Date of Birth: 02-01-1976           MRN: [id number] Visit Date: 09/06/2018 Requested by: [address] [place], [place] [address] [place], [place] [address] PCP: Carollee Herter, Alferd Apa, DO  Subjective: Chief Complaint  Patient presents with  . Right Shoulder - Pain    Pain x 3 years.  H/o surgery 2004 for labral tear.  Had been ok until 3 years ago. Had MRI 2017 - brought report.  Has tried PT and cortisone injections - continues to have pain.    HPI: She is a 42 year old right-hand-dominant female with right shoulder pain.  In approximately 2004, she developed right shoulder pain while working for [employer].  She subsequently had an MRI scan showing a labrum tear.  She underwent surgical repair and did very well after that.  She was pain-free until a few years ago when she started having pain on top of her shoulder.  The pain has not gone away despite physical therapy and over-the-counter medications.  Pain radiates a little bit into the biceps area.  She had an MRI scan in 2017 showing subacromial bursitis and AC joint arthropathy.  She also is having some left foot pain for the past couple weeks.  She does a lot of walking with work.  She did not have an injury.  Pain is on the forefoot, mostly plantar aspect.  It seems to be getting better in the past few days.  She had x-rays done recently.  She has a history of vitamin D deficiency but is not taking anything for it.                ROS: She has hypertension and migraines.  Other systems were reviewed and are negative.  Objective: Vital Signs: There were no vitals taken for this visit.  Physical Exam:  Right shoulder: Full active range of motion, no adhesive capsulitis.  Very tender at the [facility] joint and this seems to reproduce her pain.  Isometric rotator cuff strength is 5/5 throughout but she does have pain with empty can test. Left foot: Very tender to palpation at the  distal second metatarsal near the MTP joint.  Pain from dorsal and plantar approach.  Imaging: None today.  Recent foot x-rays, left foot, show a possible stress fracture of the distal second metatarsal on the medial side.  Assessment & Plan: 1.  Right shoulder pain, chronic, due to [facility] arthropathy -Discussed options with her, she wants to try an [facility] joint injection under ultrasound guidance in the near future, preferably on a Friday.  If she does not get long-term relief from this, then possibly consult with 1 of our surgeons for distal clavicle excision.  2.  Improving left foot pain, x-rays concerning for possible distal second metatarsal stress fracture -If pain persists after couple weeks, repeat x-rays. -Encouraged her to start taking vitamin D3 at 5000 IU daily long-term.   Follow-Up Instructions: Return for On a Friday for [facility] joint injection.      Procedures: No procedures performed  No notes on file    PMFS History: Patient Active Problem List   Diagnosis Date Noted  . Essential hypertension 02/14/2017  . Medial epicondylitis of right elbow 11/26/2015  . Right shoulder pain 04/01/2015  . ANEMIA, IRON DEFICIENCY 09/30/2009  . BACK PAIN 09/02/2009  . MIGRAINE HEADACHE 01/16/2007  .  INGUINAL HERNIA, HX OF 01/16/2007   Past Medical History:  Diagnosis Date  . Allergy   . Migraines    Since age 27  . Uterine fibroid     Family History  Problem Relation Age of Onset  . Diabetes Unknown   . Hypertension Unknown   . Alzheimer's disease Unknown   . Thyroid disease Mother   . Migraines Neg Hx     Past Surgical History:  Procedure Laterality Date  . CESAREAN SECTION    . CHOLECYSTECTOMY    . SHOULDER SURGERY Right    Right   Social History   Occupational History  . 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 Smoking status: Never Smoker  . Smokeless tobacco: Never Used  Substance and Sexual Activity  . Alcohol use: Yes    Alcohol/week: 0.0 standard drinks     Comment: Rare  . Drug use: No  . Sexual activity: Not on file

## 2018-09-29 ENCOUNTER — Ambulatory Visit (INDEPENDENT_AMBULATORY_CARE_PROVIDER_SITE_OTHER): Payer: 59 | Admitting: Family Medicine

## 2018-09-29 ENCOUNTER — Encounter (INDEPENDENT_AMBULATORY_CARE_PROVIDER_SITE_OTHER): Payer: Self-pay | Admitting: Family Medicine

## 2018-09-29 VITALS — Ht 60.0 in | Wt 198.0 lb

## 2018-09-29 DIAGNOSIS — G8929 Other chronic pain: Secondary | ICD-10-CM | POA: Diagnosis not present

## 2018-09-29 DIAGNOSIS — M25511 Pain in right shoulder: Secondary | ICD-10-CM

## 2018-09-29 MED ORDER — METHYLPREDNISOLONE ACETATE 40 MG/ML IJ SUSP
40.0000 mg | INTRAMUSCULAR | Status: AC | PRN
  Administered 2018-09-29: 40 mg via INTRA_ARTICULAR

## 2018-09-29 MED ORDER — LIDOCAINE HCL (PF) 1 % IJ SOLN
3.0000 mL | INTRAMUSCULAR | Status: AC | PRN
  Administered 2018-09-29: 3 mL

## 2018-09-29 NOTE — Progress Notes (Signed)
   Office Visit Note   Patient: Sheena Olsen           Date of Birth: October 17, 1975           MRN: [id number] Visit Date: 09/29/2018 Requested by: [address] [place], [place] [address] [place], [place] [address] PCP: Carollee Herter, Alferd Apa, DO  Subjective: No chief complaint on file.   HPI: She is here with persistent right shoulder pain.  AC joint arthropathy with subacromial bursitis based on MRI scan from 2017.  Her pain seems to localize to the top of her shoulder.  Incidentally her foot pain has completely resolved.                ROS: Otherwise noncontributory  Objective: Vital Signs: Ht 5' (1.524 m)   Wt 198 lb (89.8 kg)   BMI 38.67 kg/m   Physical Exam:  Right shoulder: Full active range of motion, point tender at the [facility] joint.  Pain with AC crossover test and with extremes of range of motion.  Rotator cuff strength 5/5 with minimal pain.  Imaging: None today.  Her MRI was brought with her on CD and was reviewed today.  Assessment & Plan: 1.  Persistent right shoulder pain probably due to [facility] joint arthropathy -Discussed options with her and elected to inject today under ultrasound guidance.  If pain recurs, could consider surgical consult for distal clavicle excision.   Follow-Up Instructions: No follow-ups on file.      Procedures: Large Joint Inj on 09/29/2018 9:18 AM Indications: pain Details: 25 G 1.5 in needle, ultrasound-guided superior approach  Arthrogram: No  Medications: 3 mL lidocaine (PF) 1 %; 40 mg methylPREDNISolone acetate 40 MG/ML Consent was given by the patient.      No notes on file    PMFS History: Patient Active Problem List   Diagnosis Date Noted  . Essential hypertension 02/14/2017  . Medial epicondylitis of right elbow 11/26/2015  . Right shoulder pain 04/01/2015  . ANEMIA, IRON DEFICIENCY 09/30/2009  . BACK PAIN 09/02/2009  . MIGRAINE HEADACHE 01/16/2007  . INGUINAL HERNIA, HX OF 01/16/2007   Past Medical  History:  Diagnosis Date  . Allergy   . Migraines    Since age 68  . Uterine fibroid     Family History  Problem Relation Age of Onset  . Diabetes Other   . Hypertension Other   . Alzheimer's disease Other   . Thyroid disease Mother   . Migraines Neg Hx     Past Surgical History:  Procedure Laterality Date  . CESAREAN SECTION    . CHOLECYSTECTOMY    . SHOULDER SURGERY Right    Right   Social History   Occupational History  . 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 Smoking status: Never Smoker  . Smokeless tobacco: Never Used  Substance and Sexual Activity  . Alcohol use: Yes    Alcohol/week: 0.0 standard drinks    Comment: Rare  . Drug use: No  . Sexual activity: Not on file

## 2018-09-29 NOTE — Progress Notes (Signed)
Pt stated Lt shoulder---still having aching pain especially lifting the arm. Taking meloxicam

## 2018-12-19 ENCOUNTER — Encounter: Payer: Self-pay | Admitting: Physician Assistant

## 2018-12-19 ENCOUNTER — Telehealth: Payer: 59 | Admitting: Physician Assistant

## 2018-12-19 ENCOUNTER — Encounter: Payer: Self-pay | Admitting: Family Medicine

## 2018-12-19 DIAGNOSIS — J309 Allergic rhinitis, unspecified: Secondary | ICD-10-CM | POA: Diagnosis not present

## 2018-12-19 MED ORDER — BENZONATATE 100 MG PO CAPS: 100.0000 mg | ORAL_CAPSULE | Freq: Three times a day (TID) | ORAL | 0 refills | Status: DC | PRN

## 2018-12-19 MED ORDER — CETIRIZINE HCL 10 MG PO TABS: 10.0000 mg | ORAL_TABLET | Freq: Every day | ORAL | 0 refills | Status: DC

## 2018-12-19 MED ORDER — FLUTICASONE PROPIONATE 50 MCG/ACT NA SUSP: 2.0000 | Freq: Every day | NASAL | 0 refills | Status: DC

## 2018-12-19 NOTE — Progress Notes (Signed)
E visit for Allergic Rhinitis We are sorry that you are not feeling well.  Here is how we plan to help!  Based on what you have shared with me it looks like you have Allergic Rhinitis.  Rhinitis is when a reaction occurs that causes nasal congestion, runny nose, sneezing, and itching.  Most types of rhinitis are caused by an inflammation and are associated with symptoms in the eyes ears or throat. There are several types of rhinitis.  The most common are acute rhinitis, which is usually caused by a viral illness, allergic or seasonal rhinitis, and nonallergic or year-round rhinitis.  Nasal allergies occur certain times of the year.  Allergic rhinitis is caused when allergens in the air trigger the release of histamine in the body.  Histamine causes itching, swelling, and fluid to build up in the fragile linings of the nasal passages, sinuses and eyelids.  An itchy nose and clear discharge are common.     [name] Your symptoms suggest allergies as the underlying cause. To treat this, I have also prescribed Cetirizine 10 mg once daily and Flonase, a steroid base nasal spray, use 2 sprays in each nostril once daily. You may stop using all other OTC medicines you mentioned.     I recommend the following over the counter treatments: Cetirizine10 mg once daily  I also would recommend a nasal spray: Flonase 2 sprays into each nostril once daily  I have also prescribed Tessalon Perles for your cough. Take 1-2 pills, up to three times daily as needed for cough.    HOME CARE:   You can use an over-the-counter saline nasal spray as needed  Avoid areas where there is heavy dust, mites, or molds  Stay indoors on windy days during the pollen season  Keep windows closed in home, at least in bedroom; use air conditioner.  Use high-efficiency house air filter  Keep windows closed in car, turn AC on re-circulate  Avoid playing out with dog during pollen season  GET HELP RIGHT AWAY  IF:   If your symptoms do not improve within 10 days  You become short of breath  You develop yellow or green discharge from your nose for over 3 days  You have coughing fits  MAKE SURE YOU:   Understand these instructions  Will watch your condition  Will get help right away if you are not doing well or get worse  Thank you for choosing an e-visit. Your e-visit answers were reviewed by a board certified advanced clinical practitioner to complete your personal care plan. Depending upon the condition, your plan could have included both over the counter or prescription medications. Please review your pharmacy choice. Be sure that the pharmacy you have chosen is open so that you can pick up your prescription now.  If there is a problem you may message your provider in [place] to have the prescription routed to another pharmacy. Your safety is important to [place]. If you have drug allergies check your prescription carefully.  For the next 24 hours, you can use MyChart to ask questions about today's visit, request a non-urgent call back, or ask for a work or school excuse from your e-visit provider. You will get an email in the next two days asking about your experience. I hope that your e-visit has been valuable and will speed your recovery.        I have spent 7 min in completion and review of this note- Lacy Duverney [facility]

## 2018-12-19 NOTE — Telephone Encounter (Signed)
We can do web ex if she like

## 2019-01-19 ENCOUNTER — Other Ambulatory Visit: Payer: Self-pay

## 2019-01-19 ENCOUNTER — Ambulatory Visit: Payer: Self-pay

## 2019-01-19 ENCOUNTER — Other Ambulatory Visit: Payer: Self-pay | Admitting: Family Medicine

## 2019-01-19 DIAGNOSIS — M25561 Pain in right knee: Secondary | ICD-10-CM

## 2019-04-26 DIAGNOSIS — Z98891 History of uterine scar from previous surgery: Secondary | ICD-10-CM | POA: Diagnosis not present

## 2019-04-26 DIAGNOSIS — D219 Benign neoplasm of connective and other soft tissue, unspecified: Secondary | ICD-10-CM | POA: Diagnosis not present

## 2019-04-26 DIAGNOSIS — N939 Abnormal uterine and vaginal bleeding, unspecified: Secondary | ICD-10-CM | POA: Diagnosis not present

## 2019-05-29 DIAGNOSIS — N852 Hypertrophy of uterus: Secondary | ICD-10-CM | POA: Diagnosis not present

## 2019-05-29 DIAGNOSIS — N939 Abnormal uterine and vaginal bleeding, unspecified: Secondary | ICD-10-CM | POA: Diagnosis not present

## 2019-05-29 DIAGNOSIS — D219 Benign neoplasm of connective and other soft tissue, unspecified: Secondary | ICD-10-CM | POA: Diagnosis not present

## 2019-05-29 DIAGNOSIS — Z98891 History of uterine scar from previous surgery: Secondary | ICD-10-CM | POA: Diagnosis not present

## 2019-05-29 DIAGNOSIS — D259 Leiomyoma of uterus, unspecified: Secondary | ICD-10-CM | POA: Diagnosis not present

## 2019-05-30 DIAGNOSIS — N939 Abnormal uterine and vaginal bleeding, unspecified: Secondary | ICD-10-CM | POA: Insufficient documentation

## 2019-07-05 ENCOUNTER — Other Ambulatory Visit: Payer: Self-pay

## 2019-07-05 ENCOUNTER — Encounter: Payer: Self-pay | Admitting: Family Medicine

## 2019-07-05 ENCOUNTER — Ambulatory Visit (INDEPENDENT_AMBULATORY_CARE_PROVIDER_SITE_OTHER): Payer: 59 | Admitting: Family Medicine

## 2019-07-05 VITALS — Wt 215.0 lb

## 2019-07-05 DIAGNOSIS — J9801 Acute bronchospasm: Secondary | ICD-10-CM

## 2019-07-05 DIAGNOSIS — R0683 Snoring: Secondary | ICD-10-CM

## 2019-07-05 MED ORDER — ALBUTEROL SULFATE HFA 108 (90 BASE) MCG/ACT IN AERS: 2.0000 | INHALATION_SPRAY | Freq: Four times a day (QID) | RESPIRATORY_TRACT | 2 refills | Status: DC | PRN

## 2019-07-05 NOTE — Progress Notes (Signed)
Virtual Visit via Video Note  I connected with [name] on 07/05/19 at 10:40 AM EDT by a video enabled telemedicine application and verified that I am speaking with the correct person using two identifiers.  Location: Patient: home Provider: office    I discussed the limitations of evaluation and management by telemedicine and the availability of in person appointments. The patient expressed understanding and agreed to proceed.  History of Present Illness:   pt c/o nasal congestion and pnd x few months   Pt is taking no meds  No fever  No cough, no body aches or chills  Observations/Objective: Afebrile --- -she said her bp was running a little high at her wellness check but she does not remember what is was  Pt is in NAD   Assessment and Plan: 1. Bronchospasm icheck for covid,   Inhaler ordered  if symptoms worsen go go ER or UC or call office - albuterol (VENTOLIN HFA) 108 (90 Base) MCG/ACT inhaler; Inhale 2 puffs into the lungs every 6 (six) hours as needed for wheezing or shortness of breath.  Dispense: 18 g; Refill: 2 - Novel Coronavirus, NAA ([facility])  2. Snoring Refer to pulm for sleep eval - Ambulatory referral to Pulmonology   Follow Up Instructions:    I discussed the assessment and treatment plan with the patient. The patient was provided an opportunity to ask questions and all were answered. The patient agreed with the plan and demonstrated an understanding of the instructions.   The patient was advised to call back or seek an in-person evaluation if the symptoms worsen or if the condition fails to improve as anticipated.  I provided 15 minutes of non-face-to-face time during this encounter.   [name], DO

## 2019-07-06 DIAGNOSIS — Z1159 Encounter for screening for other viral diseases: Secondary | ICD-10-CM | POA: Diagnosis not present

## 2019-07-09 ENCOUNTER — Encounter: Payer: Self-pay | Admitting: Family Medicine

## 2019-07-09 NOTE — Telephone Encounter (Signed)
There is no antihistamine in most of the mucinex meds---- which one is she taking? She can try nasacort or rhinocort otc instead of the flonase---- they are otc

## 2019-07-20 DIAGNOSIS — N939 Abnormal uterine and vaginal bleeding, unspecified: Secondary | ICD-10-CM | POA: Diagnosis not present

## 2019-07-28 DIAGNOSIS — Z01812 Encounter for preprocedural laboratory examination: Secondary | ICD-10-CM | POA: Diagnosis not present

## 2019-07-28 DIAGNOSIS — Z20828 Contact with and (suspected) exposure to other viral communicable diseases: Secondary | ICD-10-CM | POA: Diagnosis not present

## 2019-07-30 DIAGNOSIS — N939 Abnormal uterine and vaginal bleeding, unspecified: Secondary | ICD-10-CM | POA: Diagnosis not present

## 2019-07-30 DIAGNOSIS — N888 Other specified noninflammatory disorders of cervix uteri: Secondary | ICD-10-CM | POA: Diagnosis not present

## 2019-07-30 DIAGNOSIS — D259 Leiomyoma of uterus, unspecified: Secondary | ICD-10-CM | POA: Diagnosis not present

## 2019-07-30 DIAGNOSIS — N72 Inflammatory disease of cervix uteri: Secondary | ICD-10-CM | POA: Diagnosis not present

## 2019-07-30 DIAGNOSIS — N800 Endometriosis of uterus: Secondary | ICD-10-CM | POA: Diagnosis not present

## 2019-07-30 DIAGNOSIS — E669 Obesity, unspecified: Secondary | ICD-10-CM | POA: Diagnosis not present

## 2019-07-30 DIAGNOSIS — K660 Peritoneal adhesions (postprocedural) (postinfection): Secondary | ICD-10-CM | POA: Diagnosis not present

## 2019-07-30 HISTORY — PX: ABDOMINAL HYSTERECTOMY: SUR658

## 2019-08-07 IMAGING — MG DIGITAL SCREENING BILATERAL MAMMOGRAM WITH TOMO AND CAD
8 series · 8 of 24 positions shown · non-contrast
Comparison: None.

CLINICAL DATA: Screening.

EXAM:
DIGITAL SCREENING BILATERAL MAMMOGRAM WITH TOMO AND CAD

[R CC synth-2D]
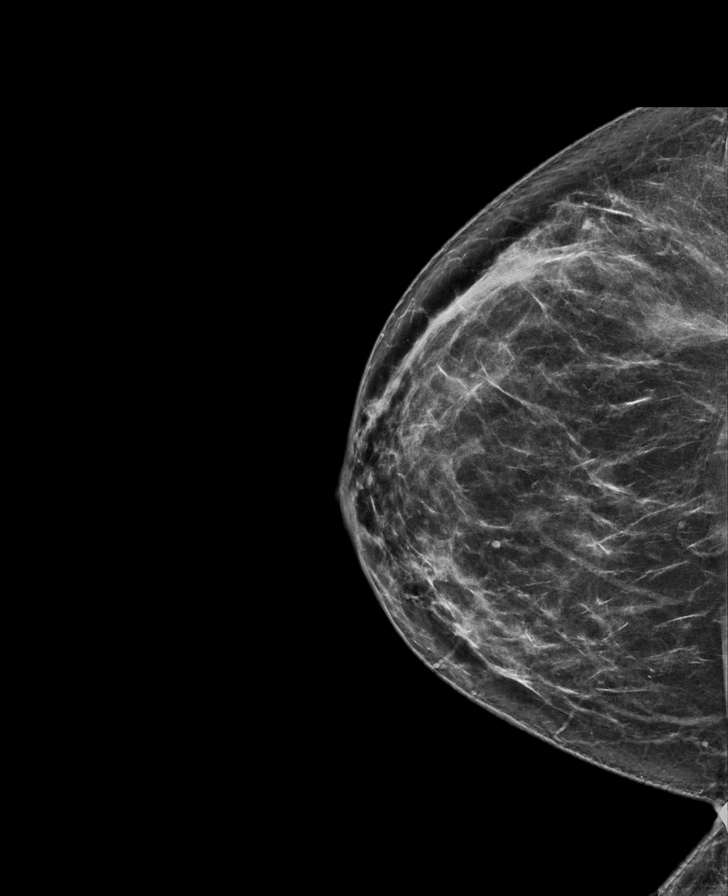

[L MLO synth-2D]
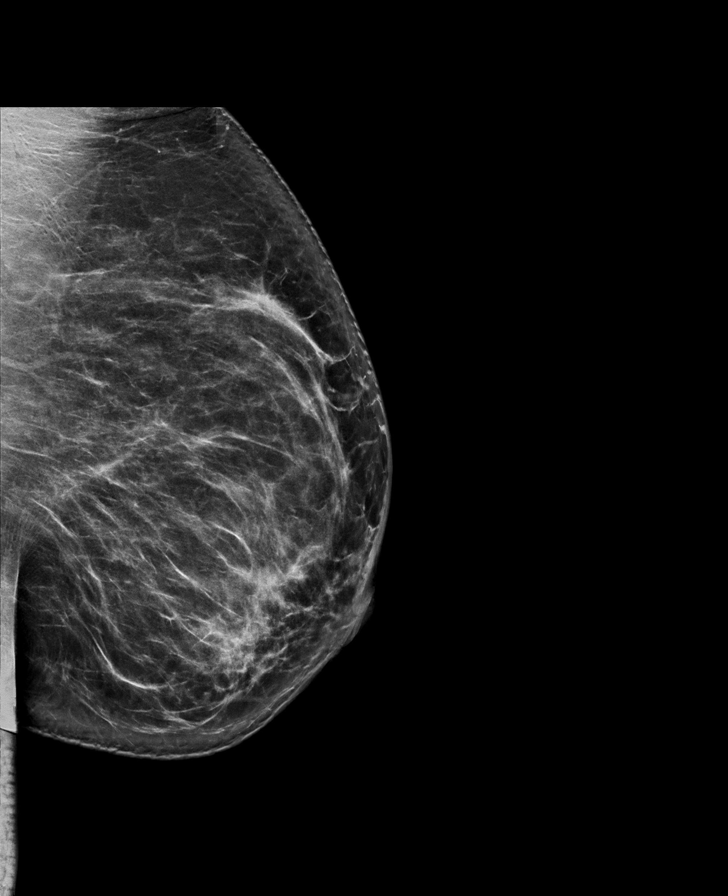

[R MLO synth-2D]
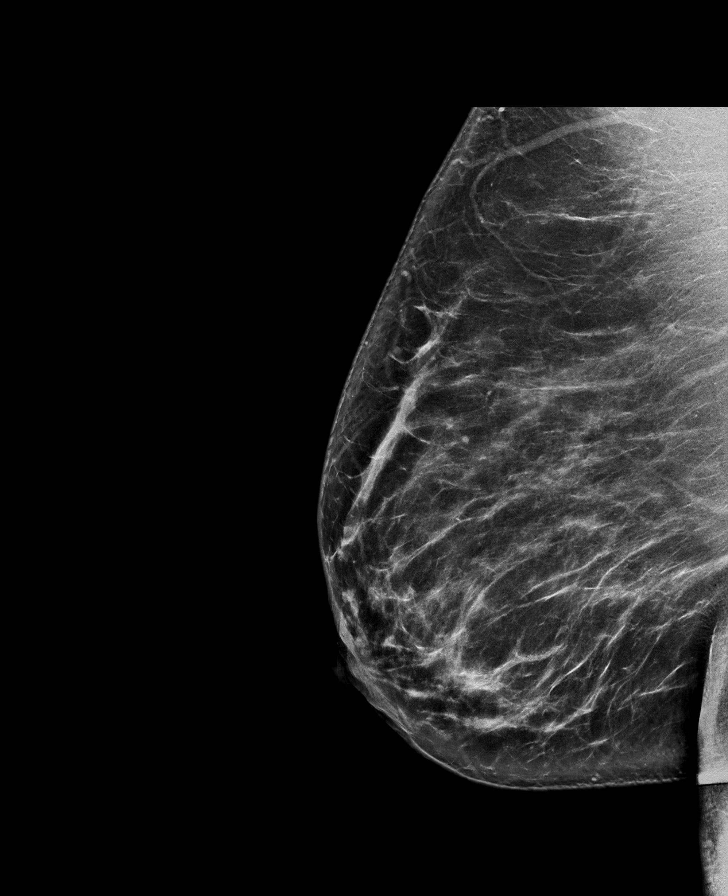

[L CC synth-2D]
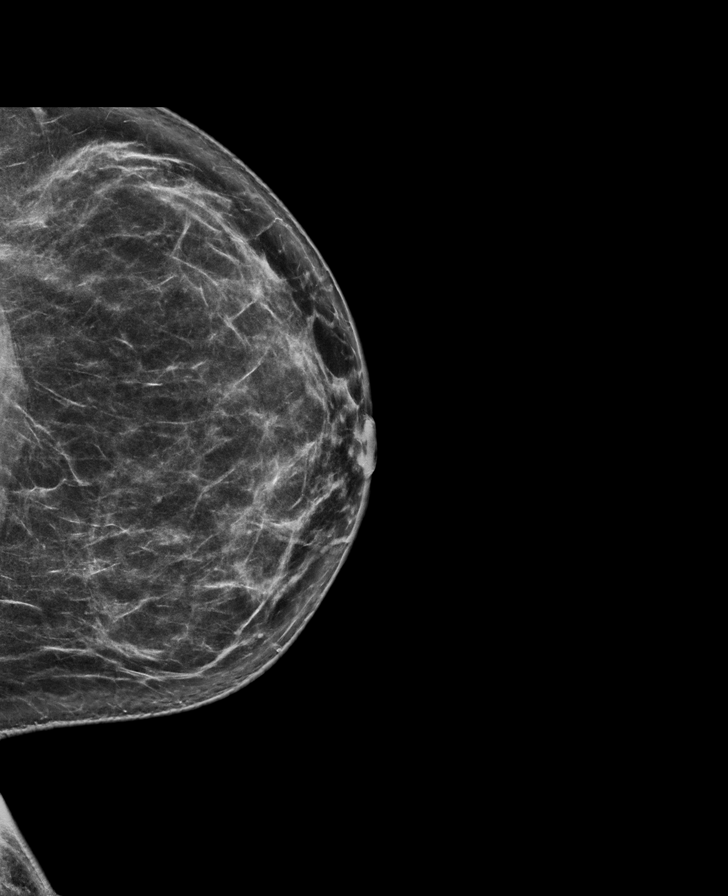

[R CC tomo · tomo slice 39/77.0]
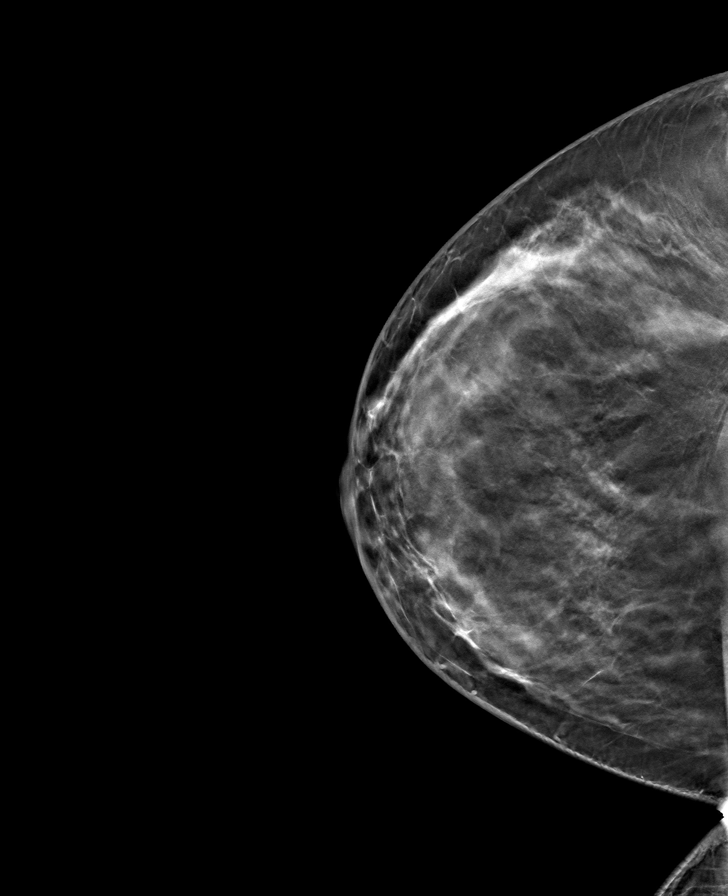

[L MLO tomo · tomo slice 43/86.0]
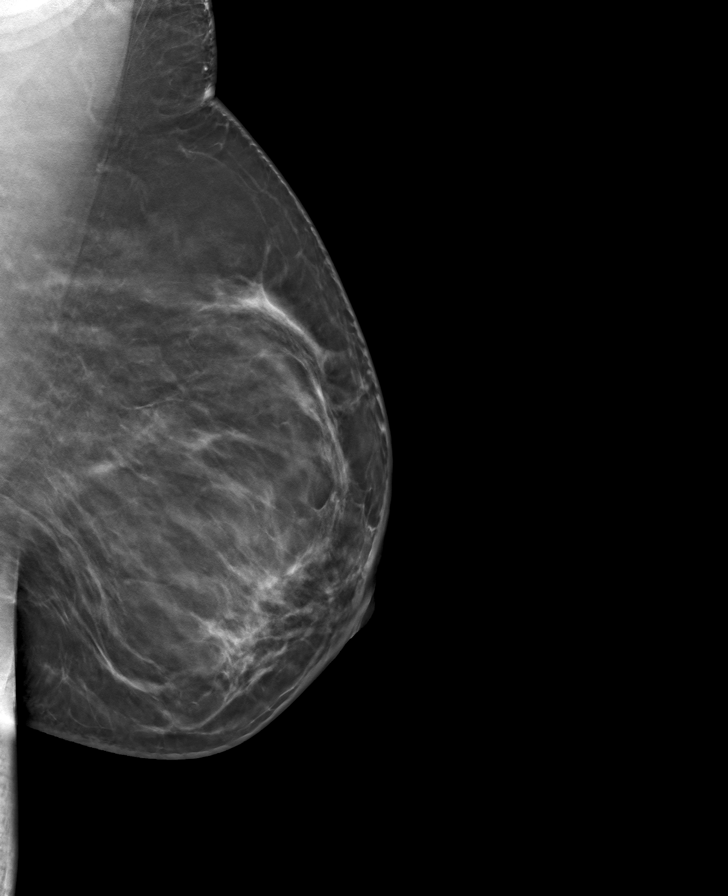

[R MLO tomo · tomo slice 45/90.0]
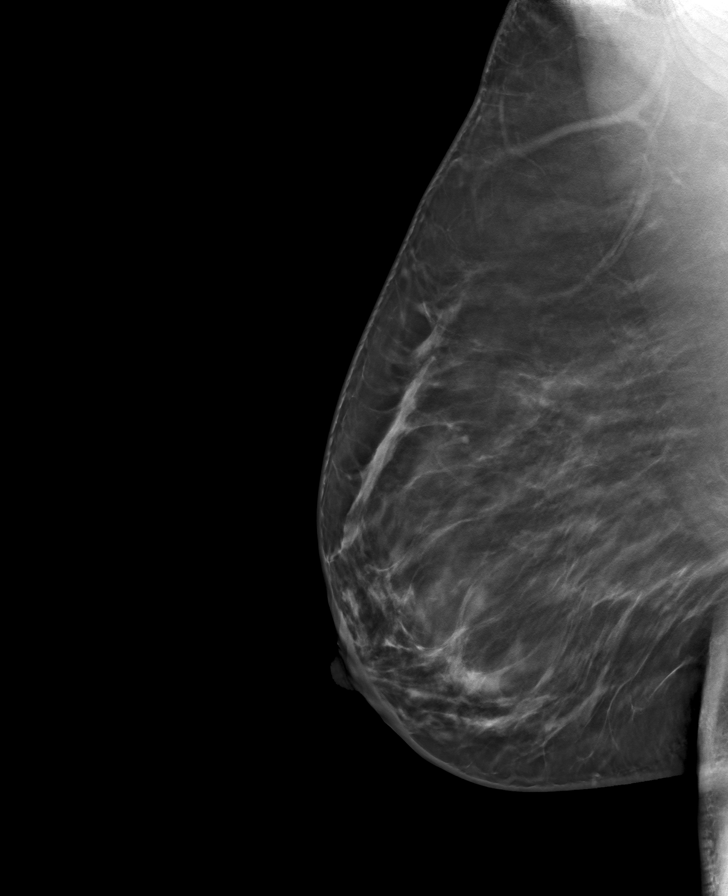

[L CC tomo · tomo slice 40/79.0]
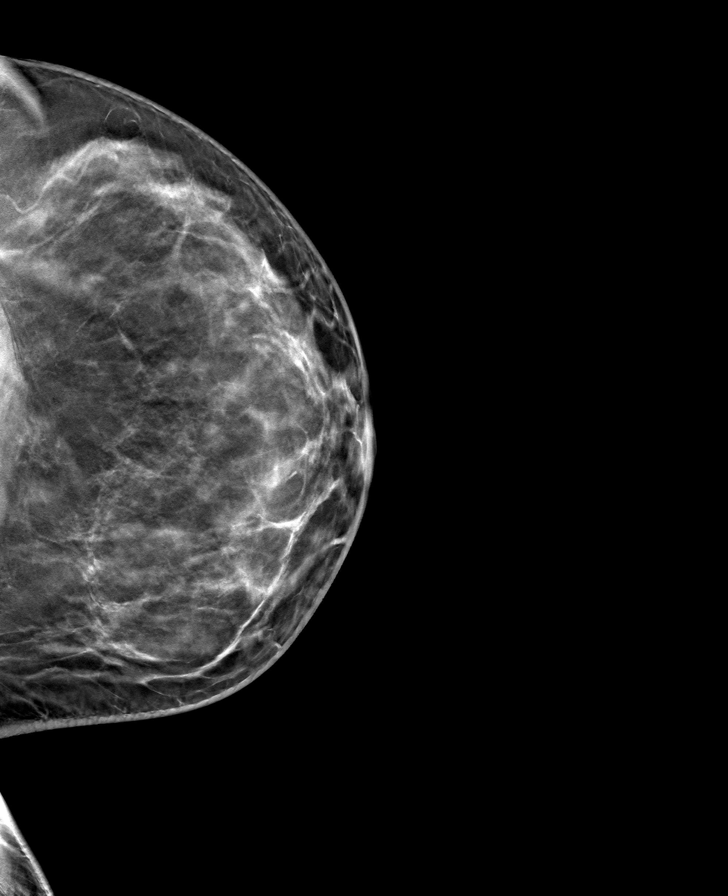

[8 of 24 positions shown; findings below may reference images not displayed]

ACR Breast Density Category b: There are scattered areas of
fibroglandular density.
FINDINGS: There are no findings suspicious for malignancy. Images were
processed with CAD.
IMPRESSION: No mammographic evidence of malignancy. A result letter of this
screening mammogram will be mailed directly to the patient.

RECOMMENDATION:
Screening mammogram in one year. (Code:[id number])

BI-RADS CATEGORY  1: Negative.

## 2019-08-16 DIAGNOSIS — T8131XA Disruption of external operation (surgical) wound, not elsewhere classified, initial encounter: Secondary | ICD-10-CM | POA: Diagnosis not present

## 2019-10-29 ENCOUNTER — Other Ambulatory Visit: Payer: Self-pay

## 2019-10-30 ENCOUNTER — Other Ambulatory Visit: Payer: Self-pay

## 2019-10-30 ENCOUNTER — Ambulatory Visit (INDEPENDENT_AMBULATORY_CARE_PROVIDER_SITE_OTHER): Payer: BC Managed Care – PPO | Admitting: Family Medicine

## 2019-10-30 VITALS — BP 122/84 | HR 109 | Temp 98.1°F | Resp 18 | Ht 60.0 in | Wt 219.6 lb

## 2019-10-30 DIAGNOSIS — I10 Essential (primary) hypertension: Secondary | ICD-10-CM

## 2019-10-30 DIAGNOSIS — R1310 Dysphagia, unspecified: Secondary | ICD-10-CM | POA: Diagnosis not present

## 2019-10-30 DIAGNOSIS — R5383 Other fatigue: Secondary | ICD-10-CM

## 2019-10-30 LAB — TSH: TSH: 0.83 u[IU]/mL (ref 0.35–4.50)

## 2019-10-30 LAB — COMPREHENSIVE METABOLIC PANEL
ALT: 31 U/L (ref 0–35)
AST: 28 U/L (ref 0–37)
Albumin: 4.2 g/dL (ref 3.5–5.2)
Alkaline Phosphatase: 61 U/L (ref 39–117)
BUN: 10 mg/dL (ref 6–23)
CO2: 27 mEq/L (ref 19–32)
Calcium: 9.5 mg/dL (ref 8.4–10.5)
Chloride: 103 mEq/L (ref 96–112)
Creatinine, Ser: 0.93 mg/dL (ref 0.40–1.20)
GFR: 79.43 mL/min (ref >60.00)
Glucose, Bld: 92 mg/dL (ref 70–99)
Potassium: 4.1 mEq/L (ref 3.5–5.1)
Sodium: 138 mEq/L (ref 135–145)
Total Bilirubin: 0.7 mg/dL (ref 0.2–1.2)
Total Protein: 7 g/dL (ref 6.0–8.3)

## 2019-10-30 LAB — CBC WITH DIFFERENTIAL/PLATELET
Basophils Absolute: 0.1 10*3/uL (ref 0.0–0.1)
Basophils Relative: 1.6 % (ref 0.0–3.0)
Eosinophils Absolute: 0.2 10*3/uL (ref 0.0–0.7)
Eosinophils Relative: 2.8 % (ref 0.0–5.0)
HCT: 43.8 % (ref 36.0–46.0)
Hemoglobin: 14.4 g/dL (ref 12.0–15.0)
Lymphocytes Relative: 31.8 % (ref 12.0–46.0)
Lymphs Abs: 2 10*3/uL (ref 0.7–4.0)
MCHC: 32.9 g/dL (ref 30.0–36.0)
MCV: 84.3 fl (ref 78.0–100.0)
Monocytes Absolute: 0.4 10*3/uL (ref 0.1–1.0)
Monocytes Relative: 6.7 % (ref 3.0–12.0)
Neutro Abs: 3.6 10*3/uL (ref 1.4–7.7)
Neutrophils Relative %: 57.1 % (ref 43.0–77.0)
Platelets: 281 10*3/uL (ref 150.0–400.0)
RBC: 5.2 Mil/uL — ABNORMAL HIGH (ref 3.87–5.11)
RDW: 19.6 % — ABNORMAL HIGH (ref 11.5–15.5)
WBC: 6.4 10*3/uL (ref 4.0–10.5)

## 2019-10-30 LAB — H. PYLORI ANTIBODY, IGG: H Pylori IgG: POSITIVE — AB

## 2019-10-30 LAB — VITAMIN D 25 HYDROXY (VIT D DEFICIENCY, FRACTURES): VITD: 12.85 ng/mL — ABNORMAL LOW (ref 30.00–100.00)

## 2019-10-30 LAB — VITAMIN B12: Vitamin B-12: 710 pg/mL (ref 211–911)

## 2019-10-30 MED ORDER — OMEPRAZOLE 20 MG PO CPDR
20.0000 mg | DELAYED_RELEASE_CAPSULE | Freq: Every day | ORAL | 3 refills | Status: DC
Start: 2019-10-30 — End: 2019-11-19

## 2019-10-30 MED ORDER — METOPROLOL SUCCINATE ER 50 MG PO TB24: 50.0000 mg | ORAL_TABLET | Freq: Every day | ORAL | 2 refills | Status: DC

## 2019-10-30 NOTE — Patient Instructions (Addendum)
COVID-19 Vaccine Information can be found at: [email or web address].uk For questions related to vaccine distribution or appointments, please email [email or web address] or call [phone].          Food Choices for Gastroesophageal Reflux Disease, Adult When you have gastroesophageal reflux disease (GERD), the foods you eat and your eating habits are very important. Choosing the right foods can help ease your discomfort. Think about working with a nutrition specialist (dietitian) to help you make good choices. What are tips for following this plan?  Meals  Choose healthy foods that are low in fat, such as fruits, vegetables, whole grains, low-fat dairy products, and lean meat, fish, and poultry.  Eat small meals often instead of 3 large meals a day. Eat your meals slowly, and in a place where you are relaxed. Avoid bending over or lying down until 2-3 hours after eating.  Avoid eating meals 2-3 hours before bed.  Avoid drinking a lot of liquid with meals.  Cook foods using methods other than frying. Bake, grill, or broil food instead.  Avoid or limit: ? Chocolate. ? Peppermint or spearmint. ? Alcohol. ? Pepper. ? Black and decaffeinated coffee. ? Black and decaffeinated tea. ? Bubbly (carbonated) soft drinks. ? Caffeinated energy drinks and soft drinks.  Limit high-fat foods such as: ? Fatty meat or fried foods. ? Whole milk, cream, butter, or ice cream. ? Nuts and nut butters. ? Pastries, donuts, and sweets made with butter or shortening.  Avoid foods that cause symptoms. These foods may be different for everyone. Common foods that cause symptoms include: ? Tomatoes. ? Oranges, lemons, and limes. ? Peppers. ? Spicy food. ? Onions and garlic. ? Vinegar. Lifestyle  Maintain a healthy weight. Ask your doctor what weight is healthy for you. If you need to lose weight, work with your doctor to do so safely.   Exercise for at least 30 minutes for 5 or more days each week, or as told by your doctor.  Wear loose-fitting clothes.  Do not smoke. If you need help quitting, ask your doctor.  Sleep with the head of your bed higher than your feet. Use a wedge under the mattress or blocks under the bed frame to raise the head of the bed. Summary  When you have gastroesophageal reflux disease (GERD), food and lifestyle choices are very important in easing your symptoms.  Eat small meals often instead of 3 large meals a day. Eat your meals slowly, and in a place where you are relaxed.  Limit high-fat foods such as fatty meat or fried foods.  Avoid bending over or lying down until 2-3 hours after eating.  Avoid peppermint and spearmint, caffeine, alcohol, and chocolate. This information is not intended to replace advice given to you by your health care provider. Make sure you discuss any questions you have with your health care provider. Document Revised: 01/04/2019 Document Reviewed: 10/19/2016 Elsevier Patient Education  [employer] DASH stands for "Dietary Approaches to Stop Hypertension." The DASH eating plan is a healthy eating plan that has been shown to reduce high blood pressure (hypertension). It may also reduce your risk for type 2 diabetes, heart disease, and stroke. The DASH eating plan may also help with weight loss. What are tips for following this plan?  General guidelines  Avoid eating more than 2,300 mg (milligrams) of salt (sodium) a day. If you have hypertension, you may need to reduce your sodium intake to 1,500 mg a day.  Limit alcohol intake to no more than 1 drink a day for nonpregnant women and 2 drinks a day for men. One drink equals 12 oz of beer, 5 oz of wine, or 1 oz of hard liquor.  Work with your health care provider to maintain a healthy body weight or to lose weight. Ask what an ideal weight is for you.  Get at least 30 minutes of  exercise that causes your heart to beat faster (aerobic exercise) most days of the week. Activities may include walking, swimming, or biking.  Work with your health care provider or diet and nutrition specialist (dietitian) to adjust your eating plan to your individual calorie needs. Reading food labels   Check food labels for the amount of sodium per serving. Choose foods with less than 5 percent of the Daily Value of sodium. Generally, foods with less than 300 mg of sodium per serving fit into this eating plan.  To find whole grains, look for the word "whole" as the first word in the ingredient list. Shopping  Buy products labeled as "low-sodium" or "no salt added."  Buy fresh foods. Avoid canned foods and premade or frozen meals. Cooking  Avoid adding salt when cooking. Use salt-free seasonings or herbs instead of table salt or sea salt. Check with your health care provider or pharmacist before using salt substitutes.  Do not fry foods. Cook foods using healthy methods such as baking, boiling, grilling, and broiling instead.  Cook with heart-healthy oils, such as olive, canola, soybean, or sunflower oil. Meal planning  Eat a balanced diet that includes: ? 5 or more servings of fruits and vegetables each day. At each meal, try to fill half of your plate with fruits and vegetables. ? Up to 6-8 servings of whole grains each day. ? Less than 6 oz of lean meat, poultry, or fish each day. A 3-oz serving of meat is about the same size as a deck of cards. One egg equals 1 oz. ? 2 servings of low-fat dairy each day. ? A serving of nuts, seeds, or beans 5 times each week. ? Heart-healthy fats. Healthy fats called Omega-3 fatty acids are found in foods such as flaxseeds and coldwater fish, like sardines, salmon, and mackerel.  Limit how much you eat of the following: ? Canned or prepackaged foods. ? Food that is high in trans fat, such as fried foods. ? Food that is high in saturated fat,  such as fatty meat. ? Sweets, desserts, sugary drinks, and other foods with added sugar. ? Full-fat dairy products.  Do not salt foods before eating.  Try to eat at least 2 vegetarian meals each week.  Eat more home-cooked food and less restaurant, buffet, and fast food.  When eating at a restaurant, ask that your food be prepared with less salt or no salt, if possible. What foods are recommended? The items listed may not be a complete list. Talk with your dietitian about what dietary choices are best for you. Grains Whole-grain or whole-wheat bread. Whole-grain or whole-wheat pasta. Brown rice. Modena Morrow. Bulgur. Whole-grain and low-sodium cereals. Pita bread. Low-fat, low-sodium crackers. Whole-wheat flour tortillas. Vegetables Fresh or frozen vegetables (raw, steamed, roasted, or grilled). Low-sodium or reduced-sodium tomato and vegetable juice. Low-sodium or reduced-sodium tomato sauce and tomato paste. Low-sodium or reduced-sodium canned vegetables. Fruits All fresh, dried, or frozen fruit. Canned fruit in natural juice (without added sugar). Meat and other protein foods Skinless chicken or [place]. Ground chicken or [place]. Pork with fat  trimmed off. Fish and seafood. Egg whites. Dried beans, peas, or lentils. Unsalted nuts, nut butters, and seeds. Unsalted canned beans. Lean cuts of beef with fat trimmed off. Low-sodium, lean deli meat. Dairy Low-fat (1%) or fat-free (skim) milk. Fat-free, low-fat, or reduced-fat cheeses. Nonfat, low-sodium ricotta or cottage cheese. Low-fat or nonfat yogurt. Low-fat, low-sodium cheese. Fats and oils Soft margarine without trans fats. Vegetable oil. Low-fat, reduced-fat, or light mayonnaise and salad dressings (reduced-sodium). Canola, safflower, olive, soybean, and sunflower oils. Avocado. Seasoning and other foods Herbs. Spices. Seasoning mixes without salt. Unsalted popcorn and pretzels. Fat-free sweets. What foods are not recommended? The  items listed may not be a complete list. Talk with your dietitian about what dietary choices are best for you. Grains Baked goods made with fat, such as croissants, muffins, or some breads. Dry pasta or rice meal packs. Vegetables Creamed or fried vegetables. Vegetables in a cheese sauce. Regular canned vegetables (not low-sodium or reduced-sodium). Regular canned tomato sauce and paste (not low-sodium or reduced-sodium). Regular tomato and vegetable juice (not low-sodium or reduced-sodium). Angie Fava. Olives. Fruits Canned fruit in a light or heavy syrup. Fried fruit. Fruit in cream or butter sauce. Meat and other protein foods Fatty cuts of meat. Ribs. Fried meat. Berniece Salines. Sausage. Bologna and other processed lunch meats. Salami. Fatback. Hotdogs. Bratwurst. Salted nuts and seeds. Canned beans with added salt. Canned or smoked fish. Whole eggs or egg yolks. Chicken or [place] with skin. Dairy Whole or 2% milk, cream, and half-and-half. Whole or full-fat cream cheese. Whole-fat or sweetened yogurt. Full-fat cheese. Nondairy creamers. Whipped toppings. Processed cheese and cheese spreads. Fats and oils Butter. Stick margarine. Lard. Shortening. Ghee. Bacon fat. Tropical oils, such as coconut, palm kernel, or palm oil. Seasoning and other foods Salted popcorn and pretzels. Onion salt, garlic salt, seasoned salt, table salt, and sea salt. Worcestershire sauce. Tartar sauce. Barbecue sauce. Teriyaki sauce. Soy sauce, including reduced-sodium. Steak sauce. Canned and packaged gravies. Fish sauce. Oyster sauce. Cocktail sauce. Horseradish that you find on the shelf. Ketchup. Mustard. Meat flavorings and tenderizers. Bouillon cubes. Hot sauce and Tabasco sauce. Premade or packaged marinades. Premade or packaged taco seasonings. Relishes. Regular salad dressings. Where to find more information:  National Heart, Lung, and [place]: https://wilson-eaton.com/  American Heart Association: www.heart.org Summary   The DASH eating plan is a healthy eating plan that has been shown to reduce high blood pressure (hypertension). It may also reduce your risk for type 2 diabetes, heart disease, and stroke.  With the DASH eating plan, you should limit salt (sodium) intake to 2,300 mg a day. If you have hypertension, you may need to reduce your sodium intake to 1,500 mg a day.  When on the DASH eating plan, aim to eat more fresh fruits and vegetables, whole grains, lean proteins, low-fat dairy, and heart-healthy fats.  Work with your health care provider or diet and nutrition specialist (dietitian) to adjust your eating plan to your individual calorie needs. This information is not intended to replace advice given to you by your health care provider. Make sure you discuss any questions you have with your health care provider. Document Revised: 08/26/2017 Document Reviewed: 09/06/2016 Elsevier Patient Education  2020 [employer].

## 2019-10-30 NOTE — Progress Notes (Signed)
Patient ID: Sheena Olsen, female    DOB: May 28, 1976  Age: 44 y.o. MRN: [id number]    Subjective:  Subjective  HPI Sheena Olsen presents for elevated bp and nausea.  No headache.   + indigestion and fatigue as well No fever  Review of Systems  Constitutional: Negative for appetite change, diaphoresis, fatigue and unexpected weight change.  Eyes: Negative for pain, redness and visual disturbance.  Respiratory: Negative for cough, chest tightness, shortness of breath and wheezing.   Cardiovascular: Negative for chest pain, palpitations and leg swelling.  Endocrine: Negative for cold intolerance, heat intolerance, polydipsia, polyphagia and polyuria.  Genitourinary: Negative for difficulty urinating, dysuria and frequency.  Neurological: Negative for dizziness, light-headedness, numbness and headaches.    History Past Medical History:  Diagnosis Date  . Allergy   . Migraines    Since age 50  . Uterine fibroid     She has a past surgical history that includes Cholecystectomy; Shoulder surgery (Right); and Cesarean section.   Her family history includes Alzheimer's disease in an other family member; Diabetes in an other family member; Hypertension in an other family member; Thyroid disease in her mother.She reports that she has never smoked. She has never used smokeless tobacco. She reports current alcohol use. She reports that she does not use drugs.  Current Outpatient Medications on File Prior to Visit  Medication Sig Dispense Refill  . cetirizine (ZYRTEC) 10 MG tablet Take 1 tablet (10 mg total) by mouth daily. 30 tablet 0  . chlorpheniramine (EQ CHLORTABS) 4 MG tablet Take 4 mg by mouth 2 (two) times daily as needed for allergies.    . fluticasone (FLONASE) 50 MCG/ACT nasal spray Place 2 sprays into both nostrils daily. 16 g 0   No current facility-administered medications on file prior to visit.     Objective:  Objective  Physical Exam Vitals and nursing note  reviewed.  Constitutional:      Appearance: She is well-developed.  HENT:     Head: Normocephalic and atraumatic.  Eyes:     Conjunctiva/sclera: Conjunctivae normal.  Neck:     Thyroid: No thyromegaly.     Vascular: No carotid bruit or JVD.  Cardiovascular:     Rate and Rhythm: Normal rate and regular rhythm.     Heart sounds: Normal heart sounds. No murmur.  Pulmonary:     Effort: Pulmonary effort is normal. No respiratory distress.     Breath sounds: Normal breath sounds. No wheezing or rales.  Chest:     Chest wall: No tenderness.  Musculoskeletal:     Cervical back: Normal range of motion and neck supple.  Neurological:     Mental Status: She is alert and oriented to person, place, and time.    BP 122/84 (BP Location: Left Arm, Patient Position: Sitting, Cuff Size: Normal)   Pulse (!) 109   Temp 98.1 F (36.7 C) (Temporal)   Resp 18   Ht 5' (1.524 m)   Wt 219 lb 9.6 oz (99.6 kg)   SpO2 97%   BMI 42.89 kg/m  Wt Readings from Last 3 Encounters:  10/30/19 219 lb 9.6 oz (99.6 kg)  07/05/19 215 lb (97.5 kg)  09/29/18 198 lb (89.8 kg)   Repeat bp 160/96   Lab Results  Component Value Date   WBC 6.4 10/30/2019   HGB 14.4 10/30/2019   HCT 43.8 10/30/2019   PLT 281.0 10/30/2019   GLUCOSE 92 10/30/2019   CHOL 186 04/24/2009   TRIG  90.0 04/24/2009   HDL 48.40 04/24/2009   LDLCALC 120 (H) 04/24/2009   ALT 31 10/30/2019   AST 28 10/30/2019   NA 138 10/30/2019   K 4.1 10/30/2019   CL 103 10/30/2019   CREATININE 0.93 10/30/2019   BUN 10 10/30/2019   CO2 27 10/30/2019   TSH 0.83 10/30/2019    DG Foot Complete Left  Result Date: 09/04/2018 CLINICAL DATA:  Pain in the plantar aspect of the left foot for the past several days. No known injury. EXAM: LEFT FOOT - COMPLETE 3+ VIEW COMPARISON:  None. FINDINGS: There is no evidence of fracture or dislocation. There is no evidence of arthropathy or other focal bone abnormality. Soft tissues are unremarkable. IMPRESSION:  Normal examination. Electronically Signed   By: Claudie Revering M.D.   On: 09/04/2018 22:23     Assessment & Plan:  Plan  I have discontinued Kathey N. Stoddard's Microgestin FE 1/20, meloxicam, benzonatate, norethindrone, and albuterol. I am also having her start on omeprazole and metoprolol succinate. Additionally, I am having her maintain her chlorpheniramine, fluticasone, and cetirizine.  Meds ordered this encounter  Medications  . omeprazole (PRILOSEC) 20 MG capsule    Sig: Take 1 capsule (20 mg total) by mouth daily.    Dispense:  30 capsule    Refill:  3  . metoprolol succinate (TOPROL-XL) 50 MG 24 hr tablet    Sig: Take 1 tablet (50 mg total) by mouth daily. Take with or immediately following a meal.    Dispense:  30 tablet    Refill:  2    Problem List Items Addressed This Visit      Unprioritized   Essential hypertension - Primary    Poorly controlled will alter medications, encouraged DASH diet, minimize caffeine and obtain adequate sleep. Report concerning symptoms and follow up as directed and as needed      Relevant Medications   metoprolol succinate (TOPROL-XL) 50 MG 24 hr tablet    Other Visit Diagnoses    Dysphagia, unspecified type       Relevant Medications   omeprazole (PRILOSEC) 20 MG capsule   Other Relevant Orders   Comprehensive metabolic panel (Completed)   H. pylori antibody, IgG (Completed)   Ambulatory referral to Gastroenterology   Other fatigue       Relevant Orders   TSH (Completed)   CBC with Differential/Platelet (Completed)   Comprehensive metabolic panel (Completed)   Vitamin B12 (Completed)   Vitamin D (25 hydroxy) (Completed)   H. pylori antibody, IgG (Completed)      Follow-up: Return in about 2 weeks (around 11/13/2019), or if symptoms worsen or fail to improve, for hypertension.  [name], DO

## 2019-10-31 ENCOUNTER — Encounter: Payer: Self-pay | Admitting: Family Medicine

## 2019-10-31 ENCOUNTER — Other Ambulatory Visit: Payer: Self-pay | Admitting: Family Medicine

## 2019-10-31 DIAGNOSIS — A048 Other specified bacterial intestinal infections: Secondary | ICD-10-CM

## 2019-10-31 DIAGNOSIS — E559 Vitamin D deficiency, unspecified: Secondary | ICD-10-CM

## 2019-10-31 MED ORDER — VITAMIN D (ERGOCALCIFEROL) 1.25 MG (50000 UNIT) PO CAPS: 50000.0000 [IU] | ORAL_CAPSULE | ORAL | 0 refills | Status: DC

## 2019-10-31 MED ORDER — OMEPRAZOLE 20 MG PO CPDR: 20.0000 mg | DELAYED_RELEASE_CAPSULE | Freq: Two times a day (BID) | ORAL | 0 refills | Status: DC

## 2019-10-31 MED ORDER — TETRACYCLINE HCL 500 MG PO CAPS: 500.0000 mg | ORAL_CAPSULE | Freq: Four times a day (QID) | ORAL | 0 refills | Status: DC

## 2019-10-31 MED ORDER — METRONIDAZOLE 500 MG PO TABS: 500.0000 mg | ORAL_TABLET | Freq: Three times a day (TID) | ORAL | 0 refills | Status: DC

## 2019-10-31 MED ORDER — BISMUTH SUBSALICYLATE 262 MG PO TABS: 1.0000 | ORAL_TABLET | Freq: Four times a day (QID) | ORAL | 0 refills | Status: DC

## 2019-10-31 NOTE — Assessment & Plan Note (Signed)
Poorly controlled will alter medications, encouraged DASH diet, minimize caffeine and obtain adequate sleep. Report concerning symptoms and follow up as directed and as needed 

## 2019-11-05 ENCOUNTER — Other Ambulatory Visit: Payer: Self-pay

## 2019-11-05 ENCOUNTER — Ambulatory Visit (INDEPENDENT_AMBULATORY_CARE_PROVIDER_SITE_OTHER): Payer: BC Managed Care – PPO | Admitting: Gastroenterology

## 2019-11-05 ENCOUNTER — Encounter: Payer: Self-pay | Admitting: Gastroenterology

## 2019-11-05 VITALS — BP 126/80 | HR 98 | Temp 97.8°F | Ht 60.0 in | Wt 219.1 lb

## 2019-11-05 DIAGNOSIS — A048 Other specified bacterial intestinal infections: Secondary | ICD-10-CM

## 2019-11-05 DIAGNOSIS — R0989 Other specified symptoms and signs involving the circulatory and respiratory systems: Secondary | ICD-10-CM | POA: Diagnosis not present

## 2019-11-05 DIAGNOSIS — E559 Vitamin D deficiency, unspecified: Secondary | ICD-10-CM

## 2019-11-05 DIAGNOSIS — Z6841 Body Mass Index (BMI) 40.0 and over, adult: Secondary | ICD-10-CM

## 2019-11-05 NOTE — Progress Notes (Signed)
Chief Complaint: Dysphagia, H. pylori infection  Referring Provider:     Carollee Herter, Alferd Apa, DO   HPI:    Sheena Olsen is a 44 y.o. female with a history of migraines, obesity (BMI 42.8), referred to the [facility] for evaluation of dysphagia and recent positive H. Pylori.  Dysphagia for the last 2 weeks. No prior similar sxs. +globus sensation. Points to suprasternal notch. No preceding issue. No hx of reflux sxs. Only with solids. Was started on omeprazole last week, with some improvement. No prior EGD.   Seen by her PCM for this issue on 10/30/2019, and started on omeprazole 20 mg/day along with stopping meloxicam, benzonatate, norethindrone, albuterol, and starting on metoprolol.  H pylori IgG positive --> increased omeprazole to BID, started Flaygl, tetracycline, Bismuth x14 days on 11/02/19  Otherwise, normal TSH, CBC (RDW 19.6), CMP, B12.  Vitamin D low at 12.5-started on ergocalciferol.  No abdominal imaging for review.  No known family history of CRC, GI malignancy, liver disease, pancreatic disease, or IBD.    Past Medical History:  Diagnosis Date  . Allergy   . Migraines    Since age 17  . Uterine fibroid      Past Surgical History:  Procedure Laterality Date  . ABDOMINAL HYSTERECTOMY  07/30/2019  . CESAREAN SECTION    . CHOLECYSTECTOMY    . SHOULDER SURGERY Right    Right   Family History  Problem Relation Age of Onset  . Diabetes Other   . Hypertension Other   . Alzheimer's disease Other   . Thyroid disease Mother   . Migraines Neg Hx   . Colon cancer Neg Hx   . Esophageal cancer Neg Hx    Social History   Tobacco Use  . Smoking status: Never Smoker  . Smokeless tobacco: Never Used  Substance Use Topics  . Alcohol use: Yes    Alcohol/week: 0.0 standard drinks    Comment: Rare  . Drug use: No   Current Outpatient Medications  Medication Sig Dispense Refill  . Bismuth Subsalicylate 99991111 MG TABS Take 1 tablet (262 mg  total) by mouth 4 (four) times daily. 56 tablet 0  . metoprolol succinate (TOPROL-XL) 50 MG 24 hr tablet Take 1 tablet (50 mg total) by mouth daily. Take with or immediately following a meal. 30 tablet 2  . metroNIDAZOLE (FLAGYL) 500 MG tablet Take 1 tablet (500 mg total) by mouth 3 (three) times daily. 42 tablet 0  . omeprazole (PRILOSEC) 20 MG capsule Take 1 capsule (20 mg total) by mouth 2 (two) times daily before a meal. 28 capsule 0  . tetracycline (SUMYCIN) 500 MG capsule Take 1 capsule (500 mg total) by mouth 4 (four) times daily. 56 capsule 0  . Vitamin D, Ergocalciferol, (DRISDOL) 1.25 MG (50000 UNIT) CAPS capsule Take 1 capsule (50,000 Units total) by mouth every 7 (seven) days. 12 capsule 0  . cetirizine (ZYRTEC) 10 MG tablet Take 1 tablet (10 mg total) by mouth daily. (Patient not taking: Reported on 11/05/2019) 30 tablet 0  . chlorpheniramine (EQ CHLORTABS) 4 MG tablet Take 4 mg by mouth 2 (two) times daily as needed for allergies.    . fluticasone (FLONASE) 50 MCG/ACT nasal spray Place 2 sprays into both nostrils daily. (Patient not taking: Reported on 11/05/2019) 16 g 0  . omeprazole (PRILOSEC) 20 MG capsule Take 1 capsule (20 mg total) by mouth daily. (Patient not  taking: Reported on 11/05/2019) 30 capsule 3   No current facility-administered medications for this visit.   Allergies  Allergen Reactions  . Other     Pollen, cat  . Gramineae Pollens Itching     Review of Systems: All systems reviewed and negative except where noted in HPI.     Physical Exam:    Wt Readings from Last 3 Encounters:  11/05/19 219 lb 2 oz (99.4 kg)  10/30/19 219 lb 9.6 oz (99.6 kg)  07/05/19 215 lb (97.5 kg)    BP 126/80   Pulse 98   Temp 97.8 F (36.6 C)   Ht 5' (1.524 m)   Wt 219 lb 2 oz (99.4 kg)   BMI 42.79 kg/m  Constitutional:  Pleasant, in no acute distress. Psychiatric: Normal mood and affect. Behavior is normal. EENT: Pupils normal.  Conjunctivae are normal. No scleral icterus.  Neck supple. No cervical LAD. Cardiovascular: Normal rate, regular rhythm. No edema Pulmonary/chest: Effort normal and breath sounds normal. No wheezing, rales or rhonchi. Abdominal: Soft, nondistended, nontender. Bowel sounds active throughout. There are no masses palpable. No hepatomegaly. Neurological: Alert and oriented to person place and time. Skin: Skin is warm and dry. No rashes noted.   ASSESSMENT AND PLAN;   1) Globus sensation: -Clinical presentation seems more consistent with globus sensation.  No history of food impactions.  Symptoms improving since starting PPI therapy -Resume high-dose PPI as currently prescribed x4-6 weeks, then titrate to lowest effective dose or potentially even off as tolerated -If no further appreciable improvement despite trial of high-dose PPI, plan for EGD with esophageal dilation as appropriate -Additionally, discussed the benefit of EGD to evaluate for further signs of reflux which can help tailor therapy.  Decided to hold off in favor of medical management for now  2) H pylori infection: -Resume quadruple therapy as currently prescribed with high-dose PPI, bismuth, tetracycline, metronidazole x14 days -Check H. pylori stool antigen 2 weeks after completion of therapy (and off PPI therapy x2 weeks)  3) Obesity (BMI 42.8): -Obesity with recent weight gain.  Certainly plays a role in manifestation of reflux  4) Vitamin D deficiency: -Continue ergocalciferol as prescribed -If persistent vitamin D deficiency despite ergocalciferol, can plan for EGD with duodenal biopsies  RTC in 1 month.  If ongoing symptoms, plan for EGD as outlined above  I spent 45 minutes of time, including in depth chart review, independent review of results as outlined above, communicating results with the patient directly, face-to-face time with the patient, coordinating care, ordering studies and medications as appropriate, and documentation.      Lavena Bullion, DO,  FACG  11/05/2019, 9:06 AM   Carollee Herter, Alferd Apa, *

## 2019-11-05 NOTE — Patient Instructions (Signed)
If you are age 44 or older, your body mass index should be between 23-30. Your Body mass index is 42.79 kg/m. If this is out of the aforementioned range listed, please consider follow up with your Primary Care Provider.  If you are age 60 or younger, your body mass index should be between 19-25. Your Body mass index is 42.79 kg/m. If this is out of the aformentioned range listed, please consider follow up with your Primary Care Provider.   It was a pleasure to see you today!  [name], D.O.

## 2019-11-19 ENCOUNTER — Encounter: Payer: Self-pay | Admitting: Family Medicine

## 2019-11-19 ENCOUNTER — Other Ambulatory Visit: Payer: Self-pay

## 2019-11-19 ENCOUNTER — Ambulatory Visit (INDEPENDENT_AMBULATORY_CARE_PROVIDER_SITE_OTHER): Payer: BC Managed Care – PPO | Admitting: Family Medicine

## 2019-11-19 DIAGNOSIS — I10 Essential (primary) hypertension: Secondary | ICD-10-CM | POA: Diagnosis not present

## 2019-11-19 NOTE — Assessment & Plan Note (Signed)
Well controlled, no changes to meds. Encouraged heart healthy diet such as the DASH diet and exercise as tolerated.  °

## 2019-11-19 NOTE — Progress Notes (Signed)
Patient ID: Sheena Olsen, female    DOB: [birth year]  Age: 44 y.o. MRN: [id number]    Subjective:  Subjective  HPI JINX CARROZZA presents for bp check.  Stomach feels better -- she has seen GI  No other complaints   Review of Systems  Constitutional: Negative for appetite change, diaphoresis, fatigue and unexpected weight change.  Eyes: Negative for pain, redness and visual disturbance.  Respiratory: Negative for cough, chest tightness, shortness of breath and wheezing.   Cardiovascular: Negative for chest pain, palpitations and leg swelling.  Endocrine: Negative for cold intolerance, heat intolerance, polydipsia, polyphagia and polyuria.  Genitourinary: Negative for difficulty urinating, dysuria and frequency.  Neurological: Negative for dizziness, light-headedness, numbness and headaches.    History Past Medical History:  Diagnosis Date  . Allergy   . Migraines    Since age 77  . Uterine fibroid     She has a past surgical history that includes Cholecystectomy; Shoulder surgery (Right); Cesarean section; and Abdominal hysterectomy (07/30/2019).   Her family history includes Alzheimer's disease in an other family member; Diabetes in an other family member; Hypertension in an other family member; Thyroid disease in her mother.She reports that she has never smoked. She has never used smokeless tobacco. She reports current alcohol use. She reports that she does not use drugs.  Current Outpatient Medications on File Prior to Visit  Medication Sig Dispense Refill  . Bismuth Subsalicylate 99991111 MG TABS Take 1 tablet (262 mg total) by mouth 4 (four) times daily. 56 tablet 0  . chlorpheniramine (EQ CHLORTABS) 4 MG tablet Take 4 mg by mouth 2 (two) times daily as needed for allergies.    . metoprolol succinate (TOPROL-XL) 50 MG 24 hr tablet Take 1 tablet (50 mg total) by mouth daily. Take with or immediately following a meal. 30 tablet 2  . omeprazole (PRILOSEC) 20 MG capsule Take 1  capsule (20 mg total) by mouth 2 (two) times daily before a meal. 28 capsule 0  . Vitamin D, Ergocalciferol, (DRISDOL) 1.25 MG (50000 UNIT) CAPS capsule Take 1 capsule (50,000 Units total) by mouth every 7 (seven) days. 12 capsule 0  . cetirizine (ZYRTEC) 10 MG tablet Take 1 tablet (10 mg total) by mouth daily. (Patient not taking: Reported on 11/05/2019) 30 tablet 0  . fluticasone (FLONASE) 50 MCG/ACT nasal spray Place 2 sprays into both nostrils daily. (Patient not taking: Reported on 11/05/2019) 16 g 0   No current facility-administered medications on file prior to visit.     Objective:  Objective  Physical Exam Vitals and nursing note reviewed.  Constitutional:      Appearance: She is well-developed.  HENT:     Head: Normocephalic and atraumatic.     Right Ear: External ear normal.     Left Ear: External ear normal.  Eyes:     General:        Right eye: No discharge.        Left eye: No discharge.     Conjunctiva/sclera: Conjunctivae normal.  Neck:     Thyroid: No thyromegaly.     Vascular: No carotid bruit or JVD.  Cardiovascular:     Rate and Rhythm: Normal rate and regular rhythm.     Heart sounds: Normal heart sounds. No murmur.  Pulmonary:     Effort: Pulmonary effort is normal. No respiratory distress.     Breath sounds: Normal breath sounds. No wheezing or rales.  Chest:     Chest wall: No tenderness.  Musculoskeletal:     Cervical back: Normal range of motion and neck supple.  Lymphadenopathy:     Cervical: No cervical adenopathy.  Neurological:     Mental Status: She is alert and oriented to person, place, and time.    BP 110/84 (BP Location: Right Arm, Patient Position: Sitting, Cuff Size: Normal)   Pulse (!) 106   Temp (!) 97.1 F (36.2 C) (Temporal)   Resp 18   Ht 5' (1.524 m)   Wt 217 lb 6.4 oz (98.6 kg)   SpO2 97%   BMI 42.46 kg/m  Wt Readings from Last 3 Encounters:  11/19/19 217 lb 6.4 oz (98.6 kg)  11/05/19 219 lb 2 oz (99.4 kg)  10/30/19 219 lb  9.6 oz (99.6 kg)     Lab Results  Component Value Date   WBC 6.4 10/30/2019   HGB 14.4 10/30/2019   HCT 43.8 10/30/2019   PLT 281.0 10/30/2019   GLUCOSE 92 10/30/2019   CHOL 186 04/24/2009   TRIG 90.0 04/24/2009   HDL 48.40 04/24/2009   LDLCALC 120 (H) 04/24/2009   ALT 31 10/30/2019   AST 28 10/30/2019   NA 138 10/30/2019   K 4.1 10/30/2019   CL 103 10/30/2019   CREATININE 0.93 10/30/2019   BUN 10 10/30/2019   CO2 27 10/30/2019   TSH 0.83 10/30/2019    DG Foot Complete Left  Result Date: 09/04/2018 CLINICAL DATA:  Pain in the plantar aspect of the left foot for the past several days. No known injury. EXAM: LEFT FOOT - COMPLETE 3+ VIEW COMPARISON:  None. FINDINGS: There is no evidence of fracture or dislocation. There is no evidence of arthropathy or other focal bone abnormality. Soft tissues are unremarkable. IMPRESSION: Normal examination. Electronically Signed   By: Claudie Revering M.D.   On: 09/04/2018 22:23     Assessment & Plan:  Plan  I have discontinued Kaisy N. Stoddard's tetracycline and metroNIDAZOLE. I am also having her maintain her chlorpheniramine, fluticasone, cetirizine, metoprolol succinate, omeprazole, Bismuth Subsalicylate, and Vitamin D (Ergocalciferol).  No orders of the defined types were placed in this encounter.   Problem List Items Addressed This Visit    None      Follow-up: Return in about 3 months (around 02/16/2020), or if symptoms worsen or fail to improve, for annual exam, fasting.  [name], DO

## 2019-11-19 NOTE — Patient Instructions (Signed)
DASH Eating Plan DASH stands for "Dietary Approaches to Stop Hypertension." The DASH eating plan is a healthy eating plan that has been shown to reduce high blood pressure (hypertension). It may also reduce your risk for type 2 diabetes, heart disease, and stroke. The DASH eating plan may also help with weight loss. What are tips for following this plan?  General guidelines  Avoid eating more than 2,300 mg (milligrams) of salt (sodium) a day. If you have hypertension, you may need to reduce your sodium intake to 1,500 mg a day.  Limit alcohol intake to no more than 1 drink a day for nonpregnant women and 2 drinks a day for men. One drink equals 12 oz of beer, 5 oz of wine, or 1 oz of hard liquor.  Work with your health care provider to maintain a healthy body weight or to lose weight. Ask what an ideal weight is for you.  Get at least 30 minutes of exercise that causes your heart to beat faster (aerobic exercise) most days of the week. Activities may include walking, swimming, or biking.  Work with your health care provider or diet and nutrition specialist (dietitian) to adjust your eating plan to your individual calorie needs. Reading food labels   Check food labels for the amount of sodium per serving. Choose foods with less than 5 percent of the Daily Value of sodium. Generally, foods with less than 300 mg of sodium per serving fit into this eating plan.  To find whole grains, look for the word "whole" as the first word in the ingredient list. Shopping  Buy products labeled as "low-sodium" or "no salt added."  Buy fresh foods. Avoid canned foods and premade or frozen meals. Cooking  Avoid adding salt when cooking. Use salt-free seasonings or herbs instead of table salt or sea salt. Check with your health care provider or pharmacist before using salt substitutes.  Do not fry foods. Cook foods using healthy methods such as baking, boiling, grilling, and broiling instead.  Cook with  heart-healthy oils, such as olive, canola, soybean, or sunflower oil. Meal planning  Eat a balanced diet that includes: ? 5 or more servings of fruits and vegetables each day. At each meal, try to fill half of your plate with fruits and vegetables. ? Up to 6-8 servings of whole grains each day. ? Less than 6 oz of lean meat, poultry, or fish each day. A 3-oz serving of meat is about the same size as a deck of cards. One egg equals 1 oz. ? 2 servings of low-fat dairy each day. ? A serving of nuts, seeds, or beans 5 times each week. ? Heart-healthy fats. Healthy fats called Omega-3 fatty acids are found in foods such as flaxseeds and coldwater fish, like sardines, salmon, and mackerel.  Limit how much you eat of the following: ? Canned or prepackaged foods. ? Food that is high in trans fat, such as fried foods. ? Food that is high in saturated fat, such as fatty meat. ? Sweets, desserts, sugary drinks, and other foods with added sugar. ? Full-fat dairy products.  Do not salt foods before eating.  Try to eat at least 2 vegetarian meals each week.  Eat more home-cooked food and less restaurant, buffet, and fast food.  When eating at a restaurant, ask that your food be prepared with less salt or no salt, if possible. What foods are recommended? The items listed may not be a complete list. Talk with your dietitian about   what dietary choices are best for you. Grains Whole-grain or whole-wheat bread. Whole-grain or whole-wheat pasta. Brown rice. Oatmeal. Quinoa. Bulgur. Whole-grain and low-sodium cereals. Pita bread. Low-fat, low-sodium crackers. Whole-wheat flour tortillas. Vegetables Fresh or frozen vegetables (raw, steamed, roasted, or grilled). Low-sodium or reduced-sodium tomato and vegetable juice. Low-sodium or reduced-sodium tomato sauce and tomato paste. Low-sodium or reduced-sodium canned vegetables. Fruits All fresh, dried, or frozen fruit. Canned fruit in natural juice (without  added sugar). Meat and other protein foods Skinless chicken or turkey. Ground chicken or turkey. Pork with fat trimmed off. Fish and seafood. Egg whites. Dried beans, peas, or lentils. Unsalted nuts, nut butters, and seeds. Unsalted canned beans. Lean cuts of beef with fat trimmed off. Low-sodium, lean deli meat. Dairy Low-fat (1%) or fat-free (skim) milk. Fat-free, low-fat, or reduced-fat cheeses. Nonfat, low-sodium ricotta or cottage cheese. Low-fat or nonfat yogurt. Low-fat, low-sodium cheese. Fats and oils Soft margarine without trans fats. Vegetable oil. Low-fat, reduced-fat, or light mayonnaise and salad dressings (reduced-sodium). Canola, safflower, olive, soybean, and sunflower oils. Avocado. Seasoning and other foods Herbs. Spices. Seasoning mixes without salt. Unsalted popcorn and pretzels. Fat-free sweets. What foods are not recommended? The items listed may not be a complete list. Talk with your dietitian about what dietary choices are best for you. Grains Baked goods made with fat, such as croissants, muffins, or some breads. Dry pasta or rice meal packs. Vegetables Creamed or fried vegetables. Vegetables in a cheese sauce. Regular canned vegetables (not low-sodium or reduced-sodium). Regular canned tomato sauce and paste (not low-sodium or reduced-sodium). Regular tomato and vegetable juice (not low-sodium or reduced-sodium). Pickles. Olives. Fruits Canned fruit in a light or heavy syrup. Fried fruit. Fruit in cream or butter sauce. Meat and other protein foods Fatty cuts of meat. Ribs. Fried meat. Bacon. Sausage. Bologna and other processed lunch meats. Salami. Fatback. Hotdogs. Bratwurst. Salted nuts and seeds. Canned beans with added salt. Canned or smoked fish. Whole eggs or egg yolks. Chicken or turkey with skin. Dairy Whole or 2% milk, cream, and half-and-half. Whole or full-fat cream cheese. Whole-fat or sweetened yogurt. Full-fat cheese. Nondairy creamers. Whipped toppings.  Processed cheese and cheese spreads. Fats and oils Butter. Stick margarine. Lard. Shortening. Ghee. Bacon fat. Tropical oils, such as coconut, palm kernel, or palm oil. Seasoning and other foods Salted popcorn and pretzels. Onion salt, garlic salt, seasoned salt, table salt, and sea salt. Worcestershire sauce. Tartar sauce. Barbecue sauce. Teriyaki sauce. Soy sauce, including reduced-sodium. Steak sauce. Canned and packaged gravies. Fish sauce. Oyster sauce. Cocktail sauce. Horseradish that you find on the shelf. Ketchup. Mustard. Meat flavorings and tenderizers. Bouillon cubes. Hot sauce and Tabasco sauce. Premade or packaged marinades. Premade or packaged taco seasonings. Relishes. Regular salad dressings. Where to find more information:  National Heart, Lung, and Blood Institute: www.nhlbi.nih.gov  American Heart Association: www.heart.org Summary  The DASH eating plan is a healthy eating plan that has been shown to reduce high blood pressure (hypertension). It may also reduce your risk for type 2 diabetes, heart disease, and stroke.  With the DASH eating plan, you should limit salt (sodium) intake to 2,300 mg a day. If you have hypertension, you may need to reduce your sodium intake to 1,500 mg a day.  When on the DASH eating plan, aim to eat more fresh fruits and vegetables, whole grains, lean proteins, low-fat dairy, and heart-healthy fats.  Work with your health care provider or diet and nutrition specialist (dietitian) to adjust your eating plan to your   individual calorie needs. This information is not intended to replace advice given to you by your health care provider. Make sure you discuss any questions you have with your health care provider. Document Revised: 08/26/2017 Document Reviewed: 09/06/2016 Elsevier Patient Education  2020 Elsevier Inc.  

## 2019-11-30 ENCOUNTER — Ambulatory Visit (INDEPENDENT_AMBULATORY_CARE_PROVIDER_SITE_OTHER): Payer: BC Managed Care – PPO | Admitting: Gastroenterology

## 2019-11-30 ENCOUNTER — Encounter: Payer: Self-pay | Admitting: Gastroenterology

## 2019-11-30 ENCOUNTER — Other Ambulatory Visit: Payer: Self-pay

## 2019-11-30 VITALS — BP 120/84 | HR 91 | Temp 98.2°F | Ht 60.0 in | Wt 219.2 lb

## 2019-11-30 DIAGNOSIS — R1110 Vomiting, unspecified: Secondary | ICD-10-CM

## 2019-11-30 DIAGNOSIS — R0989 Other specified symptoms and signs involving the circulatory and respiratory systems: Secondary | ICD-10-CM

## 2019-11-30 DIAGNOSIS — Z01818 Encounter for other preprocedural examination: Secondary | ICD-10-CM | POA: Diagnosis not present

## 2019-11-30 DIAGNOSIS — E559 Vitamin D deficiency, unspecified: Secondary | ICD-10-CM

## 2019-11-30 DIAGNOSIS — K219 Gastro-esophageal reflux disease without esophagitis: Secondary | ICD-10-CM | POA: Diagnosis not present

## 2019-11-30 DIAGNOSIS — Z6841 Body Mass Index (BMI) 40.0 and over, adult: Secondary | ICD-10-CM

## 2019-11-30 DIAGNOSIS — R09A2 Foreign body sensation, throat: Secondary | ICD-10-CM

## 2019-11-30 NOTE — Patient Instructions (Signed)
You have been scheduled for an endoscopy. Please follow written instructions given to you at your visit today. If you use inhalers (even only as needed), please bring them with you on the day of your procedure. Your physician has requested that you go to www.startemmi.com and enter the access code given to you at your visit today. This web site gives a general overview about your procedure. However, you should still follow specific instructions given to you by our office regarding your preparation for the procedure.  It was a pleasure to see you today!  [name], D.O.  

## 2019-11-30 NOTE — Progress Notes (Signed)
P  Chief Complaint:    H. pylori infection, globus sensation, regurgitation  GI History: 44 year old female with history of migraines initially seen in the GI clinic in 11/05/2019 for evaluation dysphagia H. pylori.  1) Dysphagia/Globus sensation: New onset in 09/2019. No prior similar sxs. +globus sensation. Points to suprasternal notch. No preceding issue. No hx of reflux sxs. Only with solids.  Improvement with empiric trial of omeprazole.  2) H. Pylori: H pylori IgG positive --> increased omeprazole to BID, started Flaygl, tetracycline, Bismuth x14 days on 11/02/19  HPI:     Patient is a 44 y.o. female presenting to the Gastroenterology Clinic for follow-up.  Last seen on 11/05/2019.  Treated globus sensation with high-dose PPI x4-6 weeks, with plan for EGD if no appreciable improvement.  If improving, plan for titrating to lowest effective dose to control reflux symptoms.  Was already on quadruple therapy for H. pylori, and has since completed 2 weeks ago.  Today she states dysphagia/globus sensation resolved, but now with post prandial regurgitation. No HB. Worse with supine. No chronic cough, raspy voice, sour brash, etc. Has reduced omeprazole to daily now. Not sure if sxs worsened when changed from BID to daily PPI dosing. Otherwise, no melena, hematochezia, abd pain, f/c/n/v.    Bowel habits back to normal (loose stools when taking Abx).  Tolerating p.o. intake.  Weight stable.  No new labs or abdominal imaging for review today since last appointment.  Still taking ergocalciferol vitamin D deficiency (12.5).    Review of systems:     No chest pain, no SOB, no fevers, no urinary sx   Past Medical History:  Diagnosis Date  . Allergy   . Migraines    Since age 71  . Uterine fibroid     Patient's surgical history, family medical history, social history, medications and allergies were all reviewed in Epic    Current Outpatient Medications  Medication Sig Dispense Refill  .  Bismuth Subsalicylate 99991111 MG TABS Take 1 tablet (262 mg total) by mouth 4 (four) times daily. 56 tablet 0  . cetirizine (ZYRTEC) 10 MG tablet Take 1 tablet (10 mg total) by mouth daily. (Patient taking differently: Take 10 mg by mouth daily as needed. ) 30 tablet 0  . chlorpheniramine (EQ CHLORTABS) 4 MG tablet Take 4 mg by mouth 2 (two) times daily as needed for allergies.    . fluticasone (FLONASE) 50 MCG/ACT nasal spray Place 2 sprays into both nostrils daily. (Patient taking differently: Place 2 sprays into both nostrils daily as needed. ) 16 g 0  . metoprolol succinate (TOPROL-XL) 50 MG 24 hr tablet Take 1 tablet (50 mg total) by mouth daily. Take with or immediately following a meal. 30 tablet 2  . omeprazole (PRILOSEC) 20 MG capsule Take 1 capsule (20 mg total) by mouth 2 (two) times daily before a meal. 28 capsule 0  . Vitamin D, Ergocalciferol, (DRISDOL) 1.25 MG (50000 UNIT) CAPS capsule Take 1 capsule (50,000 Units total) by mouth every 7 (seven) days. 12 capsule 0   No current facility-administered medications for this visit.    Physical Exam:     BP 120/84   Pulse 91   Temp 98.2 F (36.8 C)   Ht 5' (1.524 m)   Wt 219 lb 4 oz (99.5 kg)   BMI 42.82 kg/m   GENERAL:  Pleasant female in NAD PSYCH: : Cooperative, normal affect EENT:  conjunctiva pink, mucous membranes moist, neck supple without masses CARDIAC:  RRR, no murmur heard, no peripheral edema PULM: Normal respiratory effort, lungs CTA bilaterally, no wheezing ABDOMEN:  Nondistended, soft, nontender. No obvious masses, no hepatomegaly,  normal bowel sounds SKIN:  turgor, no lesions seen Musculoskeletal:  Normal muscle tone, normal strength NEURO: Alert and oriented x 3, no focal neurologic deficits   IMPRESSION and PLAN:    1) Regurgitation 2) Reflux 3) Globus sensation Suspect reflux based on clinical presentation along with response to PPI therapy.  Persistent postprandial regurgitation/supine regurgitation.   Discussed increasing back to high-dose PPI therapy, adding H2 RA, or EGD, and she prefers the latter.  -EGD to assess for erosive esophagitis, LES laxity, hiatal hernia due to persistent nature of reflux symptoms -Resume omeprazole 40 mg/day -Avoid eating close to bedtime, sleep with HOB elevated, avoid overeating  4) H. pylori infection: -Completed quadruple therapy-evaluate for H. pylori addition with gastric biopsies at the time of EGD as above  5) Obesity (BMI 42.8): -Obesity with recent weight gain concern play a role in reflux manifestation  6) Vitamin D deficiency: -Resume ergocalciferol prescribed -Duodenal biopsies at EGD to rule out malabsorption  The indications, risks, and benefits of EGD were explained to the patient in detail. Risks include but are not limited to bleeding, perforation, adverse reaction to medications, and cardiopulmonary compromise. Sequelae include but are not limited to the possibility of surgery, hositalization, and mortality. The patient verbalized understanding and wished to proceed. All questions answered, referred to scheduler. Further recommendations pending results of the exam.          Dominic Pea Emmalie Haigh ,DO, FACG 11/30/2019, 9:20 AM

## 2019-12-03 DIAGNOSIS — K011 Impacted teeth: Secondary | ICD-10-CM | POA: Diagnosis not present

## 2019-12-17 ENCOUNTER — Other Ambulatory Visit: Payer: Self-pay | Admitting: Gastroenterology

## 2019-12-17 ENCOUNTER — Ambulatory Visit (INDEPENDENT_AMBULATORY_CARE_PROVIDER_SITE_OTHER): Payer: BC Managed Care – PPO

## 2019-12-17 DIAGNOSIS — Z1159 Encounter for screening for other viral diseases: Secondary | ICD-10-CM | POA: Diagnosis not present

## 2019-12-18 LAB — SARS CORONAVIRUS 2 (TAT 6-24 HRS): SARS Coronavirus 2: NEGATIVE

## 2019-12-19 ENCOUNTER — Encounter: Payer: Self-pay | Admitting: Gastroenterology

## 2019-12-19 ENCOUNTER — Ambulatory Visit (AMBULATORY_SURGERY_CENTER): Payer: BC Managed Care – PPO | Admitting: Gastroenterology

## 2019-12-19 ENCOUNTER — Other Ambulatory Visit: Payer: Self-pay

## 2019-12-19 VITALS — BP 139/78 | HR 84 | Temp 97.1°F | Resp 12 | Ht 72.0 in | Wt 219.0 lb

## 2019-12-19 DIAGNOSIS — K2970 Gastritis, unspecified, without bleeding: Secondary | ICD-10-CM

## 2019-12-19 DIAGNOSIS — K219 Gastro-esophageal reflux disease without esophagitis: Secondary | ICD-10-CM

## 2019-12-19 DIAGNOSIS — K2950 Unspecified chronic gastritis without bleeding: Secondary | ICD-10-CM | POA: Diagnosis not present

## 2019-12-19 DIAGNOSIS — R0989 Other specified symptoms and signs involving the circulatory and respiratory systems: Secondary | ICD-10-CM

## 2019-12-19 DIAGNOSIS — B9681 Helicobacter pylori [H. pylori] as the cause of diseases classified elsewhere: Secondary | ICD-10-CM

## 2019-12-19 DIAGNOSIS — K3189 Other diseases of stomach and duodenum: Secondary | ICD-10-CM

## 2019-12-19 DIAGNOSIS — R1310 Dysphagia, unspecified: Secondary | ICD-10-CM | POA: Diagnosis not present

## 2019-12-19 DIAGNOSIS — E559 Vitamin D deficiency, unspecified: Secondary | ICD-10-CM

## 2019-12-19 DIAGNOSIS — R1319 Other dysphagia: Secondary | ICD-10-CM

## 2019-12-19 MED ORDER — SODIUM CHLORIDE 0.9 % IV SOLN: 500.0000 mL | Freq: Once | INTRAVENOUS | Status: DC

## 2019-12-19 NOTE — Patient Instructions (Signed)
YOU HAD AN ENDOSCOPIC PROCEDURE TODAY AT THE [facility]:   Refer to the procedure report that was given to you for any specific questions about what was found during the examination.  If the procedure report does not answer your questions, please call your gastroenterologist to clarify.  If you requested that your care partner not be given the details of your procedure findings, then the procedure report has been included in a sealed envelope for you to review at your convenience later.  YOU SHOULD EXPECT: Some feelings of bloating in the abdomen. Passage of more gas than usual.  Walking can help get rid of the air that was put into your GI tract during the procedure and reduce the bloating. If you had a lower endoscopy (such as a colonoscopy or flexible sigmoidoscopy) you may notice spotting of blood in your stool or on the toilet paper. If you underwent a bowel prep for your procedure, you may not have a normal bowel movement for a few days.  Please Note:  You might notice some irritation and congestion in your nose or some drainage.  This is from the oxygen used during your procedure.  There is no need for concern and it should clear up in a day or so.  SYMPTOMS TO REPORT IMMEDIATELY:    Following upper endoscopy (EGD)  Vomiting of blood or coffee ground material  New chest pain or pain under the shoulder blades  Painful or persistently difficult swallowing  New shortness of breath  Fever of 100F or higher  Black, tarry-looking stools  For urgent or emergent issues, a gastroenterologist can be reached at any hour by calling [phone]. Do not use MyChart messaging for urgent concerns.    DIET:  We do recommend a small meal at first, but then you may proceed to your regular diet.  Drink plenty of fluids but you should avoid alcoholic beverages for 24 hours.  ACTIVITY:  You should plan to take it easy for the rest of today and you should NOT DRIVE or use heavy machinery  until tomorrow (because of the sedation medicines used during the test).    FOLLOW UP: Our staff will call the number listed on your records 48-72 hours following your procedure to check on you and address any questions or concerns that you may have regarding the information given to you following your procedure. If we do not reach you, we will leave a message.  We will attempt to reach you two times.  During this call, we will ask if you have developed any symptoms of COVID 19. If you develop any symptoms (ie: fever, flu-like symptoms, shortness of breath, cough etc.) before then, please call [phone].  If you test positive for Covid 19 in the 2 weeks post procedure, please call and report this information to us.    If any biopsies were taken you will be contacted by phone or by letter within the next 1-3 weeks.  Please call us at [phone] if you have not heard about the biopsies in 3 weeks.    SIGNATURES/CONFIDENTIALITY: You and/or your care partner have signed paperwork which will be entered into your electronic medical record.  These signatures attest to the fact that that the information above on your After Visit Summary has been reviewed and is understood.  Full responsibility of the confidentiality of this discharge information lies with you and/or your care-partner. 

## 2019-12-19 NOTE — Progress Notes (Signed)
Pt's states no medical or surgical changes since previsit or office visit.  AER - temp DT - vitals

## 2019-12-19 NOTE — Progress Notes (Signed)
Follow up GI clinic in 3 months or sooner as needed.

## 2019-12-19 NOTE — Op Note (Signed)
[place] Patient Name: Sheena Olsen Procedure Date: 12/19/2019 10:08 AM MRN: [id number] Endoscopist: Gerrit Heck , MD Age: 44 Referring MD:  Date of Birth: 1975/10/20 Gender: Female Account #: [id number] Procedure:                Upper GI endoscopy Indications:              Dysphagia, Heartburn, Suspected esophageal reflux,                            Follow-up of Helicobacter pylori s/p quadruple                            therapy in 10/2019, Globus sensation, Vitamin D                            deficiency Medicines:                Monitored Anesthesia Care Procedure:                Pre-Anesthesia Assessment:                           - Prior to the procedure, a History and Physical                            was performed, and patient medications and                            allergies were reviewed. The patient's tolerance of                            previous anesthesia was also reviewed. The risks                            and benefits of the procedure and the sedation                            options and risks were discussed with the patient.                            All questions were answered, and informed consent                            was obtained. Prior Anticoagulants: The patient has                            taken no previous anticoagulant or antiplatelet                            agents. ASA Grade Assessment: II - A patient with                            mild systemic disease. After reviewing the risks  and benefits, the patient was deemed in                            satisfactory condition to undergo the procedure.                           After obtaining informed consent, the endoscope was                            passed under direct vision. Throughout the                            procedure, the patient's blood pressure, pulse, and                            oxygen saturations were monitored continuously.  The                            Endoscope was introduced through the mouth, and                            advanced to the second part of duodenum. The upper                            GI endoscopy was accomplished without difficulty.                            The patient tolerated the procedure well. Scope In: Scope Out: Findings:                 The examined esophagus was normal. The scope was                            withdrawn. Dilation was performed with a Maloney                            dilator with no resistance at 72 Fr. The dilation                            site was examined following endoscope reinsertion                            and showed no bleeding, mucosal tear or                            perforation. Estimated blood loss: none.                           The Z-line was regular and was found 38 cm from the                            incisors.                           Mild  inflammation characterized by congestion                            (edema) and erythema was found in the gastric                            fundus and in the gastric body. Biopsies were taken                            with a cold forceps for Helicobacter pylori                            testing. Estimated blood loss was minimal.                           The incisura, gastric antrum and pylorus were                            normal. Biopsies were taken with a cold forceps for                            Helicobacter pylori testing. Estimated blood loss                            was minimal.                           The duodenal bulb, first portion of the duodenum                            and second portion of the duodenum were normal.                            Biopsies for histology were taken with a cold                            forceps for evaluation of celiac disease. Estimated                            blood loss was minimal. Complications:            No immediate  complications. Estimated Blood Loss:     Estimated blood loss was minimal. Impression:               - Normal esophagus. Dilated with 54 Fr Maloney                            dilator.                           - Z-line regular, 38 cm from the incisors.                           - Gastritis. Biopsied.                           -  Normal incisura, antrum and pylorus. Biopsied.                           - Normal duodenal bulb, first portion of the                            duodenum and second portion of the duodenum.                            Biopsied. Recommendation:           - Patient has a contact number available for                            emergencies. The signs and symptoms of potential                            delayed complications were discussed with the                            patient. Return to normal activities tomorrow.                            Written discharge instructions were provided to the                            patient.                           - Resume previous diet.                           - Await pathology results.                           - Increase Prilosec (omeprazole) back to 20 mg PO                            BID for 8 weeks for both mucosal healing of                            gastritis along with treatment of breakthrough                            reflux symptoms since changing from BID therapy to                            daily. Take 30-60 minutes before breakfast and                            dinner. Will evaluate for improvement in reflux                            symptoms and upper GI symptoms with BID dosing.                           -  Follow-up in the GI clinic in 3 months or sooner                            as needed. Gerrit Heck, MD 12/19/2019 10:48:50 AM

## 2019-12-19 NOTE — Progress Notes (Signed)
Called to room to assist during endoscopic procedure.  Patient ID and intended procedure confirmed with present staff. Received instructions for my participation in the procedure from the performing physician.  

## 2019-12-19 NOTE — Progress Notes (Signed)
Report to PACU, RN, vss, BBS= Clear.  

## 2019-12-21 ENCOUNTER — Telehealth: Payer: Self-pay | Admitting: *Deleted

## 2019-12-21 DIAGNOSIS — B373 Candidiasis of vulva and vagina: Secondary | ICD-10-CM | POA: Diagnosis not present

## 2019-12-21 DIAGNOSIS — L298 Other pruritus: Secondary | ICD-10-CM | POA: Diagnosis not present

## 2019-12-21 NOTE — Telephone Encounter (Signed)
  Follow up Call-  Call back number 12/19/2019  Post procedure Call Back phone  # [phone]  Permission to leave phone message Yes  Some recent data might be hidden     Patient questions:  Do you have a fever, pain , or abdominal swelling? No. Pain Score  0 *  Have you tolerated food without any problems? Yes.    Have you been able to return to your normal activities? Yes.    Do you have any questions about your discharge instructions: Diet   No. Medications  No. Follow up visit  No.  Do you have questions or concerns about your Care? No.  Actions: * If pain score is 4 or above: No action needed, pain <4.  1. Have you developed a fever since your procedure? no  2.   Have you had an respiratory symptoms (SOB or cough) since your procedure? no  3.   Have you tested positive for COVID 19 since your procedure no  4.   Have you had any family members/close contacts diagnosed with the COVID 19 since your procedure? no   If yes to any of these questions please route to Joylene John, RN and Erenest Rasher, RN

## 2019-12-26 ENCOUNTER — Other Ambulatory Visit: Payer: Self-pay | Admitting: Gastroenterology

## 2019-12-26 DIAGNOSIS — K2970 Gastritis, unspecified, without bleeding: Secondary | ICD-10-CM

## 2019-12-26 DIAGNOSIS — B9681 Helicobacter pylori [H. pylori] as the cause of diseases classified elsewhere: Secondary | ICD-10-CM

## 2019-12-26 MED ORDER — METRONIDAZOLE 250 MG PO TABS: 250.0000 mg | ORAL_TABLET | Freq: Four times a day (QID) | ORAL | 0 refills | Status: AC

## 2019-12-26 MED ORDER — AMOXICILLIN 500 MG PO CAPS: 1000.0000 mg | ORAL_CAPSULE | Freq: Two times a day (BID) | ORAL | 0 refills | Status: DC

## 2019-12-26 MED ORDER — CLARITHROMYCIN 500 MG PO TABS: 500.0000 mg | ORAL_TABLET | Freq: Two times a day (BID) | ORAL | 0 refills | Status: DC

## 2020-01-18 ENCOUNTER — Other Ambulatory Visit: Payer: Self-pay | Admitting: Family Medicine

## 2020-01-18 DIAGNOSIS — E559 Vitamin D deficiency, unspecified: Secondary | ICD-10-CM

## 2020-01-22 ENCOUNTER — Other Ambulatory Visit: Payer: Self-pay | Admitting: Family Medicine

## 2020-01-22 DIAGNOSIS — I10 Essential (primary) hypertension: Secondary | ICD-10-CM

## 2020-01-22 DIAGNOSIS — A048 Other specified bacterial intestinal infections: Secondary | ICD-10-CM

## 2020-01-25 HISTORY — PX: WISDOM TOOTH EXTRACTION: SHX21

## 2020-02-01 DIAGNOSIS — Z01411 Encounter for gynecological examination (general) (routine) with abnormal findings: Secondary | ICD-10-CM | POA: Diagnosis not present

## 2020-02-01 DIAGNOSIS — N898 Other specified noninflammatory disorders of vagina: Secondary | ICD-10-CM | POA: Diagnosis not present

## 2020-02-22 ENCOUNTER — Encounter: Payer: Self-pay | Admitting: Family Medicine

## 2020-02-22 ENCOUNTER — Ambulatory Visit (INDEPENDENT_AMBULATORY_CARE_PROVIDER_SITE_OTHER): Payer: BC Managed Care – PPO | Admitting: Family Medicine

## 2020-02-22 ENCOUNTER — Other Ambulatory Visit: Payer: Self-pay

## 2020-02-22 VITALS — BP 126/80 | HR 94 | Temp 97.1°F | Resp 18 | Ht 60.0 in | Wt 216.2 lb

## 2020-02-22 DIAGNOSIS — Z0000 Encounter for general adult medical examination without abnormal findings: Secondary | ICD-10-CM | POA: Diagnosis not present

## 2020-02-22 DIAGNOSIS — M25511 Pain in right shoulder: Secondary | ICD-10-CM

## 2020-02-22 DIAGNOSIS — G8929 Other chronic pain: Secondary | ICD-10-CM | POA: Diagnosis not present

## 2020-02-22 LAB — COMPREHENSIVE METABOLIC PANEL
ALT: 23 U/L (ref 0–35)
AST: 22 U/L (ref 0–37)
Albumin: 4.1 g/dL (ref 3.5–5.2)
Alkaline Phosphatase: 66 U/L (ref 39–117)
BUN: 9 mg/dL (ref 6–23)
CO2: 28 mEq/L (ref 19–32)
Calcium: 9.2 mg/dL (ref 8.4–10.5)
Chloride: 103 mEq/L (ref 96–112)
Creatinine, Ser: 0.9 mg/dL (ref 0.40–1.20)
GFR: 82.37 mL/min (ref >60.00)
Glucose, Bld: 86 mg/dL (ref 70–99)
Potassium: 3.9 mEq/L (ref 3.5–5.1)
Sodium: 138 mEq/L (ref 135–145)
Total Bilirubin: 0.9 mg/dL (ref 0.2–1.2)
Total Protein: 6.5 g/dL (ref 6.0–8.3)

## 2020-02-22 LAB — LIPID PANEL
Cholesterol: 181 mg/dL (ref 0–200)
HDL: 50.6 mg/dL (ref >39.00)
LDL Cholesterol: 107 mg/dL — ABNORMAL HIGH (ref 0–99)
NonHDL: 130.2
Total CHOL/HDL Ratio: 4
Triglycerides: 116 mg/dL (ref 0.0–149.0)
VLDL: 23.2 mg/dL (ref 0.0–40.0)

## 2020-02-22 LAB — CBC WITH DIFFERENTIAL/PLATELET
Basophils Absolute: 0.1 10*3/uL (ref 0.0–0.1)
Basophils Relative: 1.3 % (ref 0.0–3.0)
Eosinophils Absolute: 0.2 10*3/uL (ref 0.0–0.7)
Eosinophils Relative: 3.9 % (ref 0.0–5.0)
HCT: 41.5 % (ref 36.0–46.0)
Hemoglobin: 14 g/dL (ref 12.0–15.0)
Lymphocytes Relative: 32 % (ref 12.0–46.0)
Lymphs Abs: 1.8 10*3/uL (ref 0.7–4.0)
MCHC: 33.8 g/dL (ref 30.0–36.0)
MCV: 91.3 fl (ref 78.0–100.0)
Monocytes Absolute: 0.4 10*3/uL (ref 0.1–1.0)
Monocytes Relative: 7.3 % (ref 3.0–12.0)
Neutro Abs: 3.2 10*3/uL (ref 1.4–7.7)
Neutrophils Relative %: 55.5 % (ref 43.0–77.0)
Platelets: 279 10*3/uL (ref 150.0–400.0)
RBC: 4.55 Mil/uL (ref 3.87–5.11)
RDW: 13.5 % (ref 11.5–15.5)
WBC: 5.7 10*3/uL (ref 4.0–10.5)

## 2020-02-22 LAB — HEMOGLOBIN A1C: Hgb A1c MFr Bld: 5.8 % (ref 4.6–6.5)

## 2020-02-22 LAB — TSH: TSH: 1.21 u[IU]/mL (ref 0.35–4.50)

## 2020-02-22 LAB — VITAMIN D 25 HYDROXY (VIT D DEFICIENCY, FRACTURES): VITD: 15.29 ng/mL — ABNORMAL LOW (ref 30.00–100.00)

## 2020-02-22 NOTE — Patient Instructions (Signed)

## 2020-02-24 ENCOUNTER — Other Ambulatory Visit: Payer: Self-pay | Admitting: Family Medicine

## 2020-02-24 DIAGNOSIS — E559 Vitamin D deficiency, unspecified: Secondary | ICD-10-CM

## 2020-02-25 NOTE — Progress Notes (Signed)
Subjective:     Sheena Olsen is a 44 y.o. female and is here for a comprehensive physical exam. The patient reports problems - R shoulder pain worsening -- she has seen ortho in past --no new injjury.  Social History   Socioeconomic History  . Marital status: Single    Spouse name: Not on file  . Number of children: 1  . Years of education: Masters  . Highest education level: Not on file  Occupational History  . 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 Smoking status: Never Smoker  . Smokeless tobacco: Never Used  Substance and Sexual Activity  . Alcohol use: Yes    Alcohol/week: 0.0 standard drinks    Comment: Rare  . Drug use: No  . Sexual activity: Not on file  Other Topics Concern  . Not on file  Social History Narrative   Lives with son    Caffeine use: 1 cup coffee per day or tea   Social Determinants of Health   Financial Resource Strain:   . Difficulty of Paying Living Expenses:   Food Insecurity:   . Worried About Charity fundraiser in the Last Year:   . Arboriculturist in the Last Year:   Transportation Needs:   . Film/video editor (Medical):   Marland Kitchen Lack of Transportation (Non-Medical):   Physical Activity:   . Days of Exercise per Week:   . Minutes of Exercise per Session:   Stress:   . Feeling of Stress :   Social Connections:   . Frequency of Communication with Friends and Family:   . Frequency of Social Gatherings with Friends and Family:   . Attends Religious Services:   . Active Member of Clubs or Organizations:   . Attends Archivist Meetings:   Marland Kitchen Marital Status:   Intimate Partner Violence:   . Fear of Current or Ex-Partner:   . Emotionally Abused:   Marland Kitchen Physically Abused:   . Sexually Abused:    Health Maintenance  Topic Date Due  . INFLUENZA VACCINE  04/27/2020  . PAP SMEAR-Modifier  04/26/2021  . TETANUS/TDAP  09/04/2028  . COVID-19 Vaccine  Completed  . HIV Screening  Completed    The following portions of the  patient's history were reviewed and updated as appropriate:  She  has a past medical history of Allergy, Migraines, and Uterine fibroid. She does not have any pertinent problems on file. She  has a past surgical history that includes Cholecystectomy; Shoulder surgery (Right); Cesarean section; and Abdominal hysterectomy (07/30/2019). Her family history includes Alzheimer's disease in an other family member; Diabetes in an other family member; Hypertension in an other family member; Thyroid disease in her mother. She  reports that she has never smoked. She has never used smokeless tobacco. She reports current alcohol use. She reports that she does not use drugs. She has a current medication list which includes the following prescription(s): bismuth subsalicylate, cetirizine, chlorpheniramine, fluticasone, metoprolol succinate, omeprazole, and vitamin d (ergocalciferol). Current Outpatient Medications on File Prior to Visit  Medication Sig Dispense Refill  . Bismuth Subsalicylate 99991111 MG TABS Take 1 tablet (262 mg total) by mouth 4 (four) times daily. 56 tablet 0  . cetirizine (ZYRTEC) 10 MG tablet Take 1 tablet (10 mg total) by mouth daily. (Patient taking differently: Take 10 mg by mouth daily as needed. ) 30 tablet 0  . chlorpheniramine (EQ CHLORTABS) 4 MG tablet Take 4 mg by mouth 2 (two) times  daily as needed for allergies.    . fluticasone (FLONASE) 50 MCG/ACT nasal spray Place 2 sprays into both nostrils daily. (Patient taking differently: Place 2 sprays into both nostrils daily as needed. ) 16 g 0  . metoprolol succinate (TOPROL-XL) 50 MG 24 hr tablet TAKE 1 TABLET (50 MG TOTAL) BY MOUTH DAILY. TAKE WITH OR IMMEDIATELY FOLLOWING A MEAL. 90 tablet 1  . omeprazole (PRILOSEC) 20 MG capsule TAKE 1 CAPSULE BY MOUTH EVERY DAY 90 capsule 1  . Vitamin D, Ergocalciferol, (DRISDOL) 1.25 MG (50000 UNIT) CAPS capsule TAKE 1 CAPSULE (50,000 UNITS TOTAL) BY MOUTH EVERY 7 (SEVEN) DAYS. 12 capsule 0   No  current facility-administered medications on file prior to visit.   She is allergic to other and gramineae pollens..  Review of Systems Review of Systems  Constitutional: Negative for activity change, appetite change and fatigue.  HENT: Negative for hearing loss, congestion, tinnitus and ear discharge.  dentist q20m Eyes: Negative for visual disturbance (see optho q1y -- vision corrected to 20/20 with glasses).  Respiratory: Negative for cough, chest tightness and shortness of breath.   Cardiovascular: Negative for chest pain, palpitations and leg swelling.  Gastrointestinal: Negative for abdominal pain, diarrhea, constipation and abdominal distention.  Genitourinary: Negative for urgency, frequency, decreased urine volume and difficulty urinating.  Musculoskeletal: Negative for back pain,  and gait problem.  + shoulder pain  Skin: Negative for color change, pallor and rash.  Neurological: Negative for dizziness, light-headedness, numbness and headaches.  Hematological: Negative for adenopathy. Does not bruise/bleed easily.  Psychiatric/Behavioral: Negative for suicidal ideas, confusion, sleep disturbance, self-injury, dysphoric mood, decreased concentration and agitation.       Objective:    BP 126/80 (BP Location: Right Arm, Patient Position: Sitting, Cuff Size: Normal)   Pulse 94   Temp (!) 97.1 F (36.2 C) (Temporal)   Resp 18   Ht 5' (1.524 m)   Wt 216 lb 3.2 oz (98.1 kg)   SpO2 97%   BMI 42.22 kg/m  General appearance: alert, cooperative, appears stated age and no distress Head: Normocephalic, without obvious abnormality, atraumatic Eyes: negative findings: lids and lashes normal, conjunctivae and sclerae normal and pupils equal, round, reactive to light and accomodation Ears: normal TM's and external ear canals both ears Neck: no adenopathy, no carotid bruit, no JVD, supple, symmetrical, trachea midline and thyroid not enlarged, symmetric, no  tenderness/mass/nodules Back: symmetric, no curvature. ROM normal. No CVA tenderness. Lungs: clear to auscultation bilaterally Breasts: gyn Heart: regular rate and rhythm, S1, S2 normal, no murmur, click, rub or gallop Abdomen: soft, non-tender; bowel sounds normal; no masses,  no organomegaly Pelvic: deferred--gyn Extremities: extremities normal, atraumatic, no cyanosis or edema Pulses: 2+ and symmetric Skin: Skin color, texture, turgor normal. No rashes or lesions Lymph nodes: Cervical, supraclavicular, and axillary nodes normal. Neurologic: Alert and oriented X 3, normal strength and tone. Normal symmetric reflexes. Normal coordination and gait    Assessment:    Healthy female exam.      Plan:    ghm utd Check labs  See After Visit Summary for Counseling Recommendations    1. Chronic right shoulder pain Pain worsening--- will refer back to ortho  - Ambulatory referral to Orthopedic Surgery  2. Preventative health care See above  - Lipid panel - CBC with Differential/Platelet - TSH - Comprehensive metabolic panel  3. Morbid obesity ([place]) con't diet and exercise   - Insulin, random - Vitamin D (25 hydroxy) - Hemoglobin A1c

## 2020-02-26 ENCOUNTER — Other Ambulatory Visit: Payer: Self-pay

## 2020-02-26 DIAGNOSIS — E559 Vitamin D deficiency, unspecified: Secondary | ICD-10-CM

## 2020-02-26 LAB — INSULIN, RANDOM: Insulin: 30.2 u[IU]/mL — ABNORMAL HIGH

## 2020-02-26 MED ORDER — VITAMIN D (ERGOCALCIFEROL) 1.25 MG (50000 UNIT) PO CAPS: 50000.0000 [IU] | ORAL_CAPSULE | ORAL | 0 refills | Status: DC

## 2020-02-27 ENCOUNTER — Other Ambulatory Visit: Payer: Self-pay

## 2020-02-27 MED ORDER — METFORMIN HCL ER 500 MG PO TB24: 500.0000 mg | ORAL_TABLET | Freq: Every day | ORAL | 2 refills | Status: DC

## 2020-02-29 ENCOUNTER — Other Ambulatory Visit: Payer: Self-pay

## 2020-02-29 ENCOUNTER — Ambulatory Visit: Payer: Self-pay

## 2020-02-29 ENCOUNTER — Ambulatory Visit (INDEPENDENT_AMBULATORY_CARE_PROVIDER_SITE_OTHER): Payer: BC Managed Care – PPO | Admitting: Family Medicine

## 2020-02-29 DIAGNOSIS — M25511 Pain in right shoulder: Secondary | ICD-10-CM

## 2020-02-29 DIAGNOSIS — G8929 Other chronic pain: Secondary | ICD-10-CM

## 2020-02-29 NOTE — Progress Notes (Signed)
   Office Visit Note   Patient: Sheena Olsen           Date of Birth: 21-Sep-1976           MRN: [id number] Visit Date: 02/29/2020 Requested by: [address] [place], [place] [address] [place],  [place] [address] PCP: Carollee Herter, Alferd Apa, DO  Subjective: Chief Complaint  Patient presents with  . Right Shoulder - Pain    Started hurting again 1 week ago. NKI. Been doing ice/heat. Not helping. Requesting a cortisone injection.    HPI: Is here with recurrent right shoulder pain.  AC joint injection in January 2020 gave complete pain relief until about 2 weeks ago.  Pain on top of her shoulder, no definite injury.  She would like to try another injection.              ROS:   All other systems were reviewed and are negative.  Objective: Vital Signs: There were no vitals taken for this visit.  Physical Exam:  General:  Alert and oriented, in no acute distress. Pulm:  Breathing unlabored. Psy:  Normal mood, congruent affect. Skin: No erythema Right shoulder: She has full range of motion, point tender at the [facility] joint again.  Positive crossover test.  Imaging: US Guided Needle Placement  Result Date: 02/29/2020 Right shoulder ultrasound-guided AC joint injection: After sterile prep with Betadine, injected 3 cc 1% lidocaine without epinephrine and 40 mg methylprednisolone.  Good immediate relief.   Assessment & Plan: 1.  Recurrent right shoulder AC joint arthropathy -Injection today as above.  Follow-up as needed.     Procedures: No procedures performed  No notes on file     PMFS History: Patient Active Problem List   Diagnosis Date Noted  . Abnormal uterine bleeding (AUB) 05/30/2019  . Essential hypertension 02/14/2017  . Medial epicondylitis of right elbow 11/26/2015  . Right shoulder pain 04/01/2015  . ANEMIA, IRON DEFICIENCY 09/30/2009  . BACK PAIN 09/02/2009  . MIGRAINE HEADACHE 01/16/2007  . INGUINAL HERNIA, HX OF 01/16/2007   Past Medical  History:  Diagnosis Date  . Allergy   . Migraines    Since age 72  . Uterine fibroid     Family History  Problem Relation Age of Onset  . Diabetes Other   . Hypertension Other   . Alzheimer's disease Other   . Thyroid disease Mother   . Migraines Neg Hx   . Colon cancer Neg Hx   . Esophageal cancer Neg Hx   . Stomach cancer Neg Hx   . Rectal cancer Neg Hx     Past Surgical History:  Procedure Laterality Date  . ABDOMINAL HYSTERECTOMY  07/30/2019  . CESAREAN SECTION    . CHOLECYSTECTOMY    . SHOULDER SURGERY Right    Right   Social History   Occupational History  . 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 Smoking status: Never Smoker  . Smokeless tobacco: Never Used  Substance and Sexual Activity  . Alcohol use: Yes    Alcohol/week: 0.0 standard drinks    Comment: Rare  . Drug use: No  . Sexual activity: Not on file

## 2020-03-11 ENCOUNTER — Other Ambulatory Visit: Payer: BC Managed Care – PPO

## 2020-03-11 DIAGNOSIS — K2970 Gastritis, unspecified, without bleeding: Secondary | ICD-10-CM | POA: Diagnosis not present

## 2020-03-11 DIAGNOSIS — B9681 Helicobacter pylori [H. pylori] as the cause of diseases classified elsewhere: Secondary | ICD-10-CM | POA: Diagnosis not present

## 2020-03-12 LAB — HELICOBACTER PYLORI  SPECIAL ANTIGEN
MICRO NUMBER:: 10592824
SPECIMEN QUALITY: ADEQUATE

## 2020-04-15 ENCOUNTER — Ambulatory Visit (INDEPENDENT_AMBULATORY_CARE_PROVIDER_SITE_OTHER): Payer: BC Managed Care – PPO | Admitting: Gastroenterology

## 2020-04-15 ENCOUNTER — Encounter: Payer: Self-pay | Admitting: Gastroenterology

## 2020-04-15 VITALS — BP 120/84 | HR 92 | Ht 60.0 in | Wt 212.2 lb

## 2020-04-15 DIAGNOSIS — E559 Vitamin D deficiency, unspecified: Secondary | ICD-10-CM

## 2020-04-15 DIAGNOSIS — A048 Other specified bacterial intestinal infections: Secondary | ICD-10-CM | POA: Diagnosis not present

## 2020-04-15 DIAGNOSIS — R0989 Other specified symptoms and signs involving the circulatory and respiratory systems: Secondary | ICD-10-CM | POA: Diagnosis not present

## 2020-04-15 DIAGNOSIS — Z6841 Body Mass Index (BMI) 40.0 and over, adult: Secondary | ICD-10-CM

## 2020-04-15 DIAGNOSIS — K219 Gastro-esophageal reflux disease without esophagitis: Secondary | ICD-10-CM

## 2020-04-15 MED ORDER — OMEPRAZOLE 20 MG PO CPDR: DELAYED_RELEASE_CAPSULE | ORAL | 5 refills | Status: DC

## 2020-04-15 NOTE — Progress Notes (Signed)
P  Chief Complaint:    History of H. pylori, dysphagia, globus sensation  GI History: 44 year old female with history of migraines initially seen in the GI clinic in 11/05/2019 for evaluation dysphagia and H. pylori.  1) Dysphagia/Globus sensation: New onset in 09/2019. No prior similar sxs. +globus sensation. Points to suprasternal notch. No preceding issue. No hx of reflux sxs. Only with solids.  Improvement with empiric trial of omeprazole.  2) H. Pylori: H pylori IgG positive --> increased omeprazole to BID, started Flaygl, tetracycline, Bismuth x14 days on 11/02/19.  Persistent on EGD 11/2019, treated with quad therapy with eradication by stool testing  3) GERD: Regurgitation.  No heartburn.  Worse with supine.  Controlled with PPI.  Endoscopic History: -EGD (12/19/2019, Dr. Bryan Lemma): Normal esophagus, dilated with 76 Fr Maloney, mild gastritis (biopsies + H. pylori), normal duodenum with normal biopsies  HPI:     Patient is a 44 y.o. female presenting to the Gastroenterology Clinic for follow-up.  Was last seen by me on 11/30/2019.  Due to regurgitation ongoing globus sensation, was evaluated with EGD on 12/19/2019, which can demonstrate H. pylori gastritis.  Prescribed another course of high-dose PPI with omeprazole 20 mg bid x8 weeks along with clarithromycin, metronidazole, amoxicillin x2 weeks.  Confirmed eradication of stool testing last month.  Today, she states globus improved with Prilosec, but not completely resolved.  The remainder of her symptoms have essentially resolved with PPI.  Has breakthrough when not taking omeprazole. Has decreased to omeprazole 20 mg/day, and globus was no better on BID dosing, but remainder of symptoms still well controlled.  Otherwise, no new complaints today.  Ongoing vitamin D deficiency (15.3 last month); treated with ergocalciferol follows with her PCM.    Review of systems:     No chest pain, no SOB, no fevers, no urinary sx   Past  Medical History:  Diagnosis Date  . Allergy   . Migraines    Since age 55  . Uterine fibroid     Patient's surgical history, family medical history, social history, medications and allergies were all reviewed in Epic    Current Outpatient Medications  Medication Sig Dispense Refill  . cetirizine (ZYRTEC) 10 MG tablet Take 1 tablet (10 mg total) by mouth daily. (Patient taking differently: Take 10 mg by mouth daily as needed. ) 30 tablet 0  . chlorpheniramine (EQ CHLORTABS) 4 MG tablet Take 4 mg by mouth 2 (two) times daily as needed for allergies.    . fluticasone (FLONASE) 50 MCG/ACT nasal spray Place 2 sprays into both nostrils daily. (Patient taking differently: Place 2 sprays into both nostrils daily as needed. ) 16 g 0  . omeprazole (PRILOSEC) 20 MG capsule TAKE 1 CAPSULE BY MOUTH EVERY DAY 90 capsule 1  . Vitamin D, Ergocalciferol, (DRISDOL) 1.25 MG (50000 UNIT) CAPS capsule Take 1 capsule (50,000 Units total) by mouth every 7 (seven) days. 12 capsule 0   No current facility-administered medications for this visit.    Physical Exam:     BP 120/84   Pulse 92   Ht 5' (1.524 m)   Wt 212 lb 4 oz (96.3 kg)   BMI 41.45 kg/m   GENERAL:  Pleasant female in NAD PSYCH: : Cooperative, normal affect EENT:  conjunctiva pink, mucous membranes moist, neck supple without masses CARDIAC:  RRR, no murmur heard, no peripheral edema PULM: Normal respiratory effort, lungs CTA bilaterally, no wheezing ABDOMEN:  Nondistended, soft, nontender. No obvious masses, no hepatomegaly,  normal bowel sounds SKIN:  turgor, no lesions seen Musculoskeletal:  Normal muscle tone, normal strength NEURO: Alert and oriented x 3, no focal neurologic deficits   IMPRESSION and PLAN:    1) Globus sensation 2) GERD without esophagitis Typical reflux symptoms well controlled with PPI, but still with ongoing, mild globus sensation.  No difference with high-dose PPI. -Resume Prilosec 20 mg daily.  Refill  placed today -Discussed difference between typical and atypical reflux symptoms, and how the latter may not respond as well to PPI therapy.  She describes the symptoms as minimally bothersome. -Continue antireflux lifestyle/dietary modifications -Negative ongoing symptoms, could consider doing Bravo or transnasal pH/impedance testing on PPI therapy  3) Obesity (BMI 41.5): -Has lost some weight since last appointment which could aid in reflux management  4) History of H. pylori infection: -Eradication confirmed by stool testing after recent quadruple therapy  I spent 25 minutes of time, including independent review of results as outlined above, communicating results with the patient directly, face-to-face time with the patient, coordinating care, ordering studies and medications as appropriate, and documentation.             Lavena Bullion ,DO, FACG 04/15/2020, 3:35 PM

## 2020-04-15 NOTE — Patient Instructions (Signed)
If you are age 44 or older, your body mass index should be between 23-30. Your Body mass index is 41.45 kg/m. If this is out of the aforementioned range listed, please consider follow up with your Primary Care Provider.  If you are age 19 or younger, your body mass index should be between 19-25. Your Body mass index is 41.45 kg/m. If this is out of the aformentioned range listed, please consider follow up with your Primary Care Provider.   We have sent the following medications to your pharmacy for you to pick up at your convenience: Prilosec 20 mg daily.  Follow up with me in a year.  It was a pleasure to see you today!  [name], D.O.

## 2020-05-21 ENCOUNTER — Other Ambulatory Visit: Payer: Self-pay | Admitting: Family Medicine

## 2020-05-23 ENCOUNTER — Encounter: Payer: Self-pay | Admitting: Family Medicine

## 2020-05-23 ENCOUNTER — Ambulatory Visit (INDEPENDENT_AMBULATORY_CARE_PROVIDER_SITE_OTHER): Payer: BC Managed Care – PPO | Admitting: Family Medicine

## 2020-05-23 ENCOUNTER — Other Ambulatory Visit (HOSPITAL_COMMUNITY)
Admission: RE | Admit: 2020-05-23 | Discharge: 2020-05-23 | Disposition: A | Discharge status: Home / Self Care | Payer: BC Managed Care – PPO | Source: Ambulatory Visit | Attending: Family Medicine | Admitting: Family Medicine

## 2020-05-23 ENCOUNTER — Other Ambulatory Visit: Payer: Self-pay

## 2020-05-23 VITALS — BP 104/80 | HR 87 | Temp 98.4°F | Resp 18 | Ht 60.0 in | Wt 218.2 lb

## 2020-05-23 DIAGNOSIS — R319 Hematuria, unspecified: Secondary | ICD-10-CM | POA: Diagnosis not present

## 2020-05-23 DIAGNOSIS — R300 Dysuria: Secondary | ICD-10-CM | POA: Insufficient documentation

## 2020-05-23 LAB — POC URINALSYSI DIPSTICK (AUTOMATED)
Bilirubin, UA: NEGATIVE
Glucose, UA: NEGATIVE
Ketones, UA: NEGATIVE
Leukocytes, UA: NEGATIVE
Nitrite, UA: NEGATIVE
Protein, UA: NEGATIVE
Spec Grav, UA: 1.015 (ref 1.010–1.025)
Urobilinogen, UA: 0.2 E.U./dL
pH, UA: 6 (ref 5.0–8.0)

## 2020-05-23 MED ORDER — NITROFURANTOIN MONOHYD MACRO 100 MG PO CAPS: 100.0000 mg | ORAL_CAPSULE | Freq: Two times a day (BID) | ORAL | 0 refills | Status: DC

## 2020-05-23 NOTE — Patient Instructions (Signed)
Hematuria, Adult Hematuria is blood in the urine. Blood may be visible in the urine, or it may be identified with a test. This condition can be caused by infections of the bladder, urethra, kidney, or prostate. Other possible causes include:  Kidney stones.  Cancer of the urinary tract.  Too much calcium in the urine.  Conditions that are passed from parent to child (inherited conditions).  Exercise that requires a lot of energy. Infections can usually be treated with medicine, and a kidney stone usually will pass through your urine. If neither of these is the cause of your hematuria, more tests may be needed to identify the cause of your symptoms. It is very important to tell your health care provider about any blood in your urine, even if it is painless or the blood stops without treatment. Blood in the urine, when it happens and then stops and then happens again, can be a symptom of a very serious condition, including cancer. There is no pain in the initial stages of many urinary cancers. Follow these instructions at home: Medicines  Take over-the-counter and prescription medicines only as told by your health care provider.  If you were prescribed an antibiotic medicine, take it as told by your health care provider. Do not stop taking the antibiotic even if you start to feel better. Eating and drinking  Drink enough fluid to keep your urine clear or pale yellow. It is recommended that you drink 3-4 quarts (2.8-3.8 L) a day. If you have been diagnosed with an infection, it is recommended that you drink cranberry juice in addition to large amounts of water.  Avoid caffeine, tea, and carbonated beverages. These tend to irritate the bladder.  Avoid alcohol because it may irritate the prostate (men). General instructions  If you have been diagnosed with a kidney stone, follow your health care provider's instructions about straining your urine to catch the stone.  Empty your bladder  often. Avoid holding urine for long periods of time.  If you are female: ? After a bowel movement, wipe from front to back and use each piece of toilet paper only once. ? Empty your bladder before and after sex.  Pay attention to any changes in your symptoms. Tell your health care provider about any changes or any new symptoms.  It is your responsibility to get your test results. Ask your health care provider, or the department performing the test, when your results will be ready.  Keep all follow-up visits as told by your health care provider. This is important. Contact a health care provider if:  You develop back pain.  You have a fever.  You have nausea or vomiting.  Your symptoms do not improve after 3 days.  Your symptoms get worse. Get help right away if:  You develop severe vomiting and are unable take medicine without vomiting.  You develop severe pain in your back or abdomen even though you are taking medicine.  You pass a large amount of blood in your urine.  You pass blood clots in your urine.  You feel very weak or like you might faint.  You faint. Summary  Hematuria is blood in the urine. It has many possible causes.  It is very important that you tell your health care provider about any blood in your urine, even if it is painless or the blood stops without treatment.  Take over-the-counter and prescription medicines only as told by your health care provider.  Drink enough fluid to keep   your urine clear or pale yellow. This information is not intended to replace advice given to you by your health care provider. Make sure you discuss any questions you have with your health care provider. Document Revised: 02/07/2019 Document Reviewed: 10/16/2016 Elsevier Patient Education  2020 Else[employer] Inc[employer]  

## 2020-05-23 NOTE — Progress Notes (Signed)
Patient ID: Sheena Olsen, female    DOB: [birth year]  Age: 44 y.o. MRN: [id number]    Subjective:  Subjective  HPI BUENA BOEHM presents for uti symptoms --- dysuria and pressure in low abd x 2 days   Review of Systems  Constitutional: Negative for appetite change, diaphoresis, fatigue and unexpected weight change.  Eyes: Negative for pain, redness and visual disturbance.  Respiratory: Negative for cough, chest tightness, shortness of breath and wheezing.   Cardiovascular: Negative for chest pain, palpitations and leg swelling.  Endocrine: Negative for cold intolerance, heat intolerance, polydipsia, polyphagia and polyuria.  Genitourinary: Positive for frequency and hematuria. Negative for difficulty urinating and dysuria.  Neurological: Negative for dizziness, light-headedness, numbness and headaches.    History Past Medical History:  Diagnosis Date  . Allergy   . Migraines    Since age 89  . Uterine fibroid     She has a past surgical history that includes Cholecystectomy; Shoulder surgery (Right); Cesarean section; Abdominal hysterectomy (07/30/2019); and Wisdom tooth extraction (01/25/2020).   Her family history includes Alzheimer's disease in an other family member; Diabetes in an other family member; Hypertension in an other family member; Thyroid disease in her mother.She reports that she has never smoked. She has never used smokeless tobacco. She reports current alcohol use. She reports that she does not use drugs.  Current Outpatient Medications on File Prior to Visit  Medication Sig Dispense Refill  . cetirizine (ZYRTEC) 10 MG tablet Take 1 tablet (10 mg total) by mouth daily. (Patient taking differently: Take 10 mg by mouth daily as needed. ) 30 tablet 0  . chlorpheniramine (EQ CHLORTABS) 4 MG tablet Take 4 mg by mouth 2 (two) times daily as needed for allergies.    . fluticasone (FLONASE) 50 MCG/ACT nasal spray Place 2 sprays into both nostrils daily. (Patient  taking differently: Place 2 sprays into both nostrils daily as needed. ) 16 g 0  . omeprazole (PRILOSEC) 20 MG capsule TAKE 1 CAPSULE BY MOUTH EVERY DAY 90 capsule 5  . Vitamin D, Ergocalciferol, (DRISDOL) 1.25 MG (50000 UNIT) CAPS capsule Take 1 capsule (50,000 Units total) by mouth every 7 (seven) days. 12 capsule 0   No current facility-administered medications on file prior to visit.     Objective:  Objective  Physical Exam Vitals and nursing note reviewed.  Constitutional:      Appearance: She is well-developed.  HENT:     Head: Normocephalic and atraumatic.  Eyes:     Conjunctiva/sclera: Conjunctivae normal.  Neck:     Thyroid: No thyromegaly.     Vascular: No carotid bruit or JVD.  Cardiovascular:     Rate and Rhythm: Normal rate and regular rhythm.     Heart sounds: Normal heart sounds. No murmur heard.   Pulmonary:     Effort: Pulmonary effort is normal. No respiratory distress.     Breath sounds: Normal breath sounds. No wheezing or rales.  Chest:     Chest wall: No tenderness.  Abdominal:     General: Abdomen is flat. There is no distension.     Palpations: Abdomen is soft.     Tenderness: There is no abdominal tenderness. There is no right CVA tenderness, left CVA tenderness, guarding or rebound.  Musculoskeletal:     Cervical back: Normal range of motion and neck supple.  Neurological:     Mental Status: She is alert and oriented to person, place, and time.    BP 104/80 (BP Location:  Right Arm, Patient Position: Sitting, Cuff Size: Large)   Pulse 87   Temp 98.4 F (36.9 C) (Oral)   Resp 18   Ht 5' (1.524 m)   Wt 218 lb 3.2 oz (99 kg)   SpO2 98%   BMI 42.61 kg/m  Wt Readings from Last 3 Encounters:  05/23/20 218 lb 3.2 oz (99 kg)  04/15/20 212 lb 4 oz (96.3 kg)  02/22/20 216 lb 3.2 oz (98.1 kg)     Lab Results  Component Value Date   WBC 5.7 02/22/2020   HGB 14.0 02/22/2020   HCT 41.5 02/22/2020   PLT 279.0 02/22/2020   GLUCOSE 86 02/22/2020     CHOL 181 02/22/2020   TRIG 116.0 02/22/2020   HDL 50.60 02/22/2020   LDLCALC 107 (H) 02/22/2020   ALT 23 02/22/2020   AST 22 02/22/2020   NA 138 02/22/2020   K 3.9 02/22/2020   CL 103 02/22/2020   CREATININE 0.90 02/22/2020   BUN 9 02/22/2020   CO2 28 02/22/2020   TSH 1.21 02/22/2020   HGBA1C 5.8 02/22/2020    DG Foot Complete Left  Result Date: 09/04/2018 CLINICAL DATA:  Pain in the plantar aspect of the left foot for the past several days. No known injury. EXAM: LEFT FOOT - COMPLETE 3+ VIEW COMPARISON:  None. FINDINGS: There is no evidence of fracture or dislocation. There is no evidence of arthropathy or other focal bone abnormality. Soft tissues are unremarkable. IMPRESSION: Normal examination. Electronically Signed   By: Claudie Revering M.D.   On: 09/04/2018 22:23     Assessment & Plan:  Plan  I am having [place] start on nitrofurantoin (macrocrystal-monohydrate). I am also having her maintain her chlorpheniramine, fluticasone, cetirizine, Vitamin D (Ergocalciferol), and omeprazole.  Meds ordered this encounter  Medications  . nitrofurantoin, macrocrystal-monohydrate, (MACROBID) 100 MG capsule    Sig: Take 1 capsule (100 mg total) by mouth 2 (two) times daily.    Dispense:  14 capsule    Refill:  0    Problem List Items Addressed This Visit    None    Visit Diagnoses    Dysuria    -  Primary   Relevant Medications   nitrofurantoin, macrocrystal-monohydrate, (MACROBID) 100 MG capsule   Other Relevant Orders   POCT Urinalysis Dipstick (Automated) (Completed)   Urine cytology ancillary only()   Urine Culture (Completed)   Hematuria, unspecified type       Relevant Medications   nitrofurantoin, macrocrystal-monohydrate, (MACROBID) 100 MG capsule   Other Relevant Orders   Urine Culture (Completed)      Follow-up: Return if symptoms worsen or fail to improve.  [name], DO

## 2020-05-25 LAB — URINE CULTURE
MICRO NUMBER:: 10881842
SPECIMEN QUALITY:: ADEQUATE

## 2020-05-26 LAB — URINE CYTOLOGY ANCILLARY ONLY
Bacterial Vaginitis-Urine: NEGATIVE
Candida Urine: NEGATIVE
Chlamydia: NEGATIVE
Comment: NEGATIVE
Comment: NEGATIVE
Comment: NORMAL
Neisseria Gonorrhea: NEGATIVE
Trichomonas: NEGATIVE

## 2020-06-19 ENCOUNTER — Other Ambulatory Visit: Payer: Self-pay

## 2020-06-19 ENCOUNTER — Encounter: Payer: Self-pay | Admitting: Family Medicine

## 2020-06-19 MED ORDER — METFORMIN HCL ER 500 MG PO TB24: 500.0000 mg | ORAL_TABLET | Freq: Every day | ORAL | 1 refills | Status: DC

## 2020-06-19 NOTE — Telephone Encounter (Signed)
Medication no longer on med list. Please advise 

## 2020-06-19 NOTE — Telephone Encounter (Signed)
Yes -- con't --refill 3 months

## 2020-06-24 ENCOUNTER — Encounter: Payer: Self-pay | Admitting: Family Medicine

## 2020-06-24 ENCOUNTER — Other Ambulatory Visit: Payer: Self-pay

## 2020-06-24 ENCOUNTER — Ambulatory Visit (INDEPENDENT_AMBULATORY_CARE_PROVIDER_SITE_OTHER): Payer: BC Managed Care – PPO | Admitting: Family Medicine

## 2020-06-24 VITALS — BP 110/84 | HR 91 | Temp 98.8°F | Resp 18 | Ht 60.0 in | Wt 216.0 lb

## 2020-06-24 DIAGNOSIS — E88819 Insulin resistance, unspecified: Secondary | ICD-10-CM

## 2020-06-24 DIAGNOSIS — E8881 Metabolic syndrome: Secondary | ICD-10-CM | POA: Diagnosis not present

## 2020-06-24 DIAGNOSIS — M546 Pain in thoracic spine: Secondary | ICD-10-CM | POA: Diagnosis not present

## 2020-06-24 DIAGNOSIS — I10 Essential (primary) hypertension: Secondary | ICD-10-CM | POA: Diagnosis not present

## 2020-06-24 MED ORDER — CYCLOBENZAPRINE HCL 10 MG PO TABS: 10.0000 mg | ORAL_TABLET | Freq: Three times a day (TID) | ORAL | 0 refills | Status: DC | PRN

## 2020-06-24 NOTE — Patient Instructions (Signed)
Acute Back Pain, Adult Acute back pain is sudden and usually short-lived. It is often caused by an injury to the muscles and tissues in the back. The injury may result from:  A muscle or ligament getting overstretched or torn (strained). Ligaments are tissues that connect bones to each other. Lifting something improperly can cause a back strain.  Wear and tear (degeneration) of the spinal disks. Spinal disks are circular tissue that provides cushioning between the bones of the spine (vertebrae).  Twisting motions, such as while playing sports or doing yard work.  A hit to the back.  Arthritis. You may have a physical exam, lab tests, and imaging tests to find the cause of your pain. Acute back pain usually goes away with rest and home care. Follow these instructions at home: Managing pain, stiffness, and swelling  Take over-the-counter and prescription medicines only as told by your health care provider.  Your health care provider may recommend applying ice during the first 24-48 hours after your pain starts. To do this: ? Put ice in a plastic bag. ? Place a towel between your skin and the bag. ? Leave the ice on for 20 minutes, 2-3 times a day.  If directed, apply heat to the affected area as often as told by your health care provider. Use the heat source that your health care provider recommends, such as a moist heat pack or a heating pad. ? Place a towel between your skin and the heat source. ? Leave the heat on for 20-30 minutes. ? Remove the heat if your skin turns bright red. This is especially important if you are unable to feel pain, heat, or cold. You have a greater risk of getting burned. Activity   Do not stay in bed. Staying in bed for more than 1-2 days can delay your recovery.  Sit up and stand up straight. Avoid leaning forward when you sit, or hunching over when you stand. ? If you work at a desk, sit close to it so you do not need to lean over. Keep your chin tucked  in. Keep your neck drawn back, and keep your elbows bent at a right angle. Your arms should look like the letter "L." ? Sit high and close to the steering wheel when you drive. Add lower back (lumbar) support to your car seat, if needed.  Take short walks on even surfaces as soon as you are able. Try to increase the length of time you walk each day.  Do not sit, drive, or stand in one place for more than 30 minutes at a time. Sitting or standing for long periods of time can put stress on your back.  Do not drive or use heavy machinery while taking prescription pain medicine.  Use proper lifting techniques. When you bend and lift, use positions that put less stress on your back: ? Bend your knees. ? Keep the load close to your body. ? Avoid twisting.  Exercise regularly as told by your health care provider. Exercising helps your back heal faster and helps prevent back injuries by keeping muscles strong and flexible.  Work with a physical therapist to make a safe exercise program, as recommended by your health care provider. Do any exercises as told by your physical therapist. Lifestyle  Maintain a healthy weight. Extra weight puts stress on your back and makes it difficult to have good posture.  Avoid activities or situations that make you feel anxious or stressed. Stress and anxiety increase muscle   tension and can make back pain worse. Learn ways to manage anxiety and stress, such as through exercise. General instructions  Sleep on a firm mattress in a comfortable position. Try lying on your side with your knees slightly bent. If you lie on your back, put a pillow under your knees.  Follow your treatment plan as told by your health care provider. This may include: ? Cognitive or behavioral therapy. ? Acupuncture or massage therapy. ? Meditation or yoga. Contact a health care provider if:  You have pain that is not relieved with rest or medicine.  You have increasing pain going down  into your legs or buttocks.  Your pain does not improve after 2 weeks.  You have pain at night.  You lose weight without trying.  You have a fever or chills. Get help right away if:  You develop new bowel or bladder control problems.  You have unusual weakness or numbness in your arms or legs.  You develop nausea or vomiting.  You develop abdominal pain.  You feel faint. Summary  Acute back pain is sudden and usually short-lived.  Use proper lifting techniques. When you bend and lift, use positions that put less stress on your back.  Take over-the-counter and prescription medicines and apply heat or ice as directed by your health care provider. This information is not intended to replace advice given to you by your health care provider. Make sure you discuss any questions you have with your health care provider. Document Revised: 01/02/2019 Document Reviewed: 04/27/2017 Elsevier Patient Education  2020 [employer]vier Inc.  

## 2020-06-24 NOTE — Progress Notes (Signed)
Patient ID: Sheena Olsen, female    DOB: 01-16-1976  Age: 44 y.o. MRN: [id number]    Subjective:  Subjective  HPI Sheena Olsen presents for thoracic back pain ---   No known injury but she did just get back from [place].    No radiation of pain -- pain is in bra line.   She also needs repeat labs and refill on her metformin for insulin resistance.   Review of Systems  Constitutional: Negative for appetite change, diaphoresis, fatigue and unexpected weight change.  Eyes: Negative for pain, redness and visual disturbance.  Respiratory: Negative for cough, chest tightness, shortness of breath and wheezing.   Cardiovascular: Negative for chest pain, palpitations and leg swelling.  Endocrine: Negative for cold intolerance, heat intolerance, polydipsia, polyphagia and polyuria.  Genitourinary: Negative for difficulty urinating, dysuria and frequency.  Musculoskeletal: Positive for back pain.  Neurological: Negative for dizziness, light-headedness, numbness and headaches.    History Past Medical History:  Diagnosis Date  . Allergy   . Migraines    Since age 37  . Uterine fibroid     She has a past surgical history that includes Cholecystectomy; Shoulder surgery (Right); Cesarean section; Abdominal hysterectomy (07/30/2019); and Wisdom tooth extraction (01/25/2020).   Her family history includes Alzheimer's disease in an other family member; Diabetes in an other family member; Hypertension in an other family member; Thyroid disease in her mother.She reports that she has never smoked. She has never used smokeless tobacco. She reports current alcohol use. She reports that she does not use drugs.  Current Outpatient Medications on File Prior to Visit  Medication Sig Dispense Refill  . cetirizine (ZYRTEC) 10 MG tablet Take 1 tablet (10 mg total) by mouth daily. (Patient taking differently: Take 10 mg by mouth daily as needed. ) 30 tablet 0  . chlorpheniramine (EQ CHLORTABS) 4 MG tablet Take 4  mg by mouth 2 (two) times daily as needed for allergies.    . fluticasone (FLONASE) 50 MCG/ACT nasal spray Place 2 sprays into both nostrils daily. (Patient taking differently: Place 2 sprays into both nostrils daily as needed. ) 16 g 0  . metFORMIN (GLUCOPHAGE-XR) 500 MG 24 hr tablet Take 1 tablet (500 mg total) by mouth daily with breakfast. 90 tablet 1  . omeprazole (PRILOSEC) 20 MG capsule TAKE 1 CAPSULE BY MOUTH EVERY DAY 90 capsule 5  . Vitamin D, Ergocalciferol, (DRISDOL) 1.25 MG (50000 UNIT) CAPS capsule Take 1 capsule (50,000 Units total) by mouth every 7 (seven) days. 12 capsule 0   No current facility-administered medications on file prior to visit.     Objective:  Objective  Physical Exam Vitals and nursing note reviewed.  Constitutional:      Appearance: She is well-developed.  HENT:     Head: Normocephalic and atraumatic.  Eyes:     Conjunctiva/sclera: Conjunctivae normal.  Neck:     Thyroid: No thyromegaly.     Vascular: No carotid bruit or JVD.  Cardiovascular:     Rate and Rhythm: Normal rate and regular rhythm.     Heart sounds: Normal heart sounds. No murmur heard.   Pulmonary:     Effort: Pulmonary effort is normal. No respiratory distress.     Breath sounds: Normal breath sounds. No wheezing or rales.  Chest:     Chest wall: No tenderness.  Musculoskeletal:        General: Tenderness present.     Cervical back: Normal range of motion and neck supple.  Thoracic back: Spasms and tenderness present. Decreased range of motion.       Back:  Neurological:     Mental Status: She is alert and oriented to person, place, and time.    BP 110/84 (BP Location: Left Arm, Patient Position: Sitting, Cuff Size: Large)   Pulse 91   Temp 98.8 F (37.1 C) (Oral)   Resp 18   Ht 5' (1.524 m)   Wt 216 lb (98 kg)   SpO2 98%   BMI 42.18 kg/m  Wt Readings from Last 3 Encounters:  06/24/20 216 lb (98 kg)  05/23/20 218 lb 3.2 oz (99 kg)  04/15/20 212 lb 4 oz (96.3  kg)     Lab Results  Component Value Date   WBC 5.7 02/22/2020   HGB 14.0 02/22/2020   HCT 41.5 02/22/2020   PLT 279.0 02/22/2020   GLUCOSE 91 06/24/2020   CHOL 185 06/24/2020   TRIG 243 (H) 06/24/2020   HDL 50 06/24/2020   LDLCALC 98 06/24/2020   ALT 14 06/24/2020   AST 17 06/24/2020   NA 138 06/24/2020   K 4.2 06/24/2020   CL 103 06/24/2020   CREATININE 0.94 06/24/2020   BUN 8 06/24/2020   CO2 27 06/24/2020   TSH 1.21 02/22/2020   HGBA1C 5.7 (H) 06/24/2020    No results found.   Assessment & Plan:  Plan  I have discontinued Sheena Olsen's nitrofurantoin (macrocrystal-monohydrate). I am also having her start on cyclobenzaprine. Additionally, I am having her maintain her chlorpheniramine, fluticasone, cetirizine, Vitamin D (Ergocalciferol), omeprazole, and metFORMIN.  Meds ordered this encounter  Medications  . cyclobenzaprine (FLEXERIL) 10 MG tablet    Sig: Take 1 tablet (10 mg total) by mouth 3 (three) times daily as needed for muscle spasms.    Dispense:  30 tablet    Refill:  0    Problem List Items Addressed This Visit    None    Visit Diagnoses    Acute left-sided thoracic back pain    -  Primary   Relevant Medications   cyclobenzaprine (FLEXERIL) 10 MG tablet   Hypertension, unspecified type       Relevant Orders   Lipid panel (Completed)   Comprehensive metabolic panel (Completed)   Hemoglobin A1c (Completed)   Insulin resistance       Relevant Orders   Insulin, random   Hemoglobin A1c (Completed)      Follow-up: Return if symptoms worsen or fail to improve.  [name], DO

## 2020-06-25 DIAGNOSIS — E8881 Metabolic syndrome: Secondary | ICD-10-CM | POA: Insufficient documentation

## 2020-06-25 DIAGNOSIS — I10 Essential (primary) hypertension: Secondary | ICD-10-CM | POA: Insufficient documentation

## 2020-06-25 DIAGNOSIS — M546 Pain in thoracic spine: Secondary | ICD-10-CM | POA: Insufficient documentation

## 2020-06-25 LAB — COMPREHENSIVE METABOLIC PANEL
AG Ratio: 1.7 (calc) (ref 1.0–2.5)
ALT: 14 U/L (ref 6–29)
AST: 17 U/L (ref 10–30)
Albumin: 4.3 g/dL (ref 3.6–5.1)
Alkaline phosphatase (APISO): 57 U/L (ref 31–125)
BUN: 8 mg/dL (ref 7–25)
CO2: 27 mmol/L (ref 20–32)
Calcium: 9.7 mg/dL (ref 8.6–10.2)
Chloride: 103 mmol/L (ref 98–110)
Creat: 0.94 mg/dL (ref 0.50–1.10)
Globulin: 2.6 g/dL (calc) (ref 1.9–3.7)
Glucose, Bld: 91 mg/dL (ref 65–99)
Potassium: 4.2 mmol/L (ref 3.5–5.3)
Sodium: 138 mmol/L (ref 135–146)
Total Bilirubin: 0.5 mg/dL (ref 0.2–1.2)
Total Protein: 6.9 g/dL (ref 6.1–8.1)

## 2020-06-25 LAB — LIPID PANEL
Cholesterol: 185 mg/dL (ref <200)
HDL: 50 mg/dL (ref >=50)
LDL Cholesterol (Calc): 98 mg/dL (calc)
Non-HDL Cholesterol (Calc): 135 mg/dL (calc) — ABNORMAL HIGH (ref <130)
Total CHOL/HDL Ratio: 3.7 (calc) (ref <5.0)
Triglycerides: 243 mg/dL — ABNORMAL HIGH (ref <150)

## 2020-06-25 LAB — HEMOGLOBIN A1C
Hgb A1c MFr Bld: 5.7 % of total Hgb — ABNORMAL HIGH (ref <5.7)
Mean Plasma Glucose: 117 (calc)
eAG (mmol/L): 6.5 (calc)

## 2020-06-25 LAB — INSULIN, RANDOM: Insulin: 67.6 u[IU]/mL — ABNORMAL HIGH

## 2020-06-25 NOTE — Assessment & Plan Note (Signed)
Well controlled, no changes to meds. Encouraged heart healthy diet such as the DASH diet and exercise as tolerated.  °

## 2020-06-25 NOTE — Assessment & Plan Note (Signed)
Check labs today con't diet  Refill metformin

## 2020-06-26 ENCOUNTER — Encounter: Payer: Self-pay | Admitting: Family Medicine

## 2020-06-28 ENCOUNTER — Other Ambulatory Visit: Payer: Self-pay

## 2020-06-28 DIAGNOSIS — M546 Pain in thoracic spine: Secondary | ICD-10-CM

## 2020-06-28 MED ORDER — CYCLOBENZAPRINE HCL 10 MG PO TABS: 10.0000 mg | ORAL_TABLET | Freq: Three times a day (TID) | ORAL | 0 refills | Status: DC | PRN

## 2020-07-19 ENCOUNTER — Other Ambulatory Visit: Payer: Self-pay | Admitting: Family Medicine

## 2020-07-19 DIAGNOSIS — I10 Essential (primary) hypertension: Secondary | ICD-10-CM

## 2020-07-24 ENCOUNTER — Other Ambulatory Visit: Payer: Self-pay | Admitting: Family Medicine

## 2020-07-24 ENCOUNTER — Ambulatory Visit (INDEPENDENT_AMBULATORY_CARE_PROVIDER_SITE_OTHER): Payer: BC Managed Care – PPO | Admitting: Family Medicine

## 2020-07-24 ENCOUNTER — Other Ambulatory Visit: Payer: Self-pay

## 2020-07-24 ENCOUNTER — Encounter: Payer: Self-pay | Admitting: Family Medicine

## 2020-07-24 ENCOUNTER — Other Ambulatory Visit (HOSPITAL_COMMUNITY)
Admission: RE | Admit: 2020-07-24 | Discharge: 2020-07-24 | Disposition: A | Discharge status: Home / Self Care | Payer: BC Managed Care – PPO | Source: Ambulatory Visit | Attending: Family Medicine | Admitting: Family Medicine

## 2020-07-24 VITALS — BP 108/78 | HR 109 | Temp 98.4°F | Resp 18 | Ht 60.0 in | Wt 220.2 lb

## 2020-07-24 DIAGNOSIS — R319 Hematuria, unspecified: Secondary | ICD-10-CM

## 2020-07-24 DIAGNOSIS — R8290 Unspecified abnormal findings in urine: Secondary | ICD-10-CM

## 2020-07-24 DIAGNOSIS — B373 Candidiasis of vulva and vagina: Secondary | ICD-10-CM | POA: Diagnosis not present

## 2020-07-24 DIAGNOSIS — N898 Other specified noninflammatory disorders of vagina: Secondary | ICD-10-CM | POA: Insufficient documentation

## 2020-07-24 LAB — POC URINALSYSI DIPSTICK (AUTOMATED)
Bilirubin, UA: NEGATIVE
Glucose, UA: NEGATIVE
Ketones, UA: NEGATIVE
Leukocytes, UA: NEGATIVE
Nitrite, UA: NEGATIVE
Protein, UA: POSITIVE — AB
Spec Grav, UA: >=1.03 — AB (ref 1.010–1.025)
Urobilinogen, UA: 0.2 E.U./dL
pH, UA: 6 (ref 5.0–8.0)

## 2020-07-24 MED ORDER — CEPHALEXIN 500 MG PO CAPS: 500.0000 mg | ORAL_CAPSULE | Freq: Two times a day (BID) | ORAL | 0 refills | Status: DC

## 2020-07-24 MED FILL — CEPHALEXIN 500 MG CAPSULE: 500 | 7 days supply | Qty: 14 | Fill #0

## 2020-07-24 NOTE — Progress Notes (Signed)
Patient ID: Sheena Olsen, female    DOB: 1976/04/17  Age: 44 y.o. MRN: [id number]    Subjective:  Subjective  HPI Sheena Olsen presents for urinary symptoms ----suprapubic pressure and frequency x few days No fever , no back pin Review of Systems  Constitutional: Negative for appetite change, diaphoresis, fatigue and unexpected weight change.  Eyes: Negative for pain, redness and visual disturbance.  Respiratory: Negative for cough, chest tightness, shortness of breath and wheezing.   Cardiovascular: Negative for chest pain, palpitations and leg swelling.  Endocrine: Negative for cold intolerance, heat intolerance, polydipsia, polyphagia and polyuria.  Genitourinary: Negative for difficulty urinating, dysuria and frequency.  Neurological: Negative for dizziness, light-headedness, numbness and headaches.    History Past Medical History:  Diagnosis Date  . Allergy   . Migraines    Since age 79  . Uterine fibroid     She has a past surgical history that includes Cholecystectomy; Shoulder surgery (Right); Cesarean section; Abdominal hysterectomy (07/30/2019); and Wisdom tooth extraction (01/25/2020).   Her family history includes Alzheimer's disease in an other family member; Diabetes in an other family member; Hypertension in an other family member; Thyroid disease in her mother.She reports that she has never smoked. She has never used smokeless tobacco. She reports current alcohol use. She reports that she does not use drugs.  Current Outpatient Medications on File Prior to Visit  Medication Sig Dispense Refill  . cetirizine (ZYRTEC) 10 MG tablet Take 1 tablet (10 mg total) by mouth daily. (Patient taking differently: Take 10 mg by mouth daily as needed. ) 30 tablet 0  . chlorpheniramine (EQ CHLORTABS) 4 MG tablet Take 4 mg by mouth 2 (two) times daily as needed for allergies.    . cyclobenzaprine (FLEXERIL) 10 MG tablet Take 1 tablet (10 mg total) by mouth 3 (three) times  daily as needed for muscle spasms. 30 tablet 0  . fluticasone (FLONASE) 50 MCG/ACT nasal spray Place 2 sprays into both nostrils daily. (Patient taking differently: Place 2 sprays into both nostrils daily as needed. ) 16 g 0  . metFORMIN (GLUCOPHAGE-XR) 500 MG 24 hr tablet Take 1 tablet (500 mg total) by mouth daily with breakfast. 90 tablet 1  . metoprolol succinate (TOPROL-XL) 50 MG 24 hr tablet TAKE 1 TABLET (50 MG TOTAL) BY MOUTH DAILY. TAKE WITH OR IMMEDIATELY FOLLOWING A MEAL. 90 tablet 1  . omeprazole (PRILOSEC) 20 MG capsule TAKE 1 CAPSULE BY MOUTH EVERY DAY 90 capsule 5  . Vitamin D, Ergocalciferol, (DRISDOL) 1.25 MG (50000 UNIT) CAPS capsule Take 1 capsule (50,000 Units total) by mouth every 7 (seven) days. 12 capsule 0   No current facility-administered medications on file prior to visit.     Objective:  Objective  Physical Exam Vitals and nursing note reviewed.  Constitutional:      Appearance: She is well-developed.  HENT:     Head: Normocephalic and atraumatic.  Eyes:     Conjunctiva/sclera: Conjunctivae normal.  Neck:     Thyroid: No thyromegaly.     Vascular: No carotid bruit or JVD.  Cardiovascular:     Rate and Rhythm: Normal rate and regular rhythm.     Heart sounds: Normal heart sounds. No murmur heard.   Pulmonary:     Effort: Pulmonary effort is normal. No respiratory distress.     Breath sounds: Normal breath sounds. No wheezing or rales.  Chest:     Chest wall: No tenderness.  Musculoskeletal:     Cervical back:  Normal range of motion and neck supple.  Neurological:     Mental Status: She is alert and oriented to person, place, and time.    BP 108/78 (BP Location: Left Arm, Patient Position: Sitting, Cuff Size: Large)   Pulse (!) 109   Temp 98.4 F (36.9 C) (Oral)   Resp 18   Ht 5' (1.524 m)   Wt 220 lb 3.2 oz (99.9 kg)   SpO2 97%   BMI 43.00 kg/m  Wt Readings from Last 3 Encounters:  07/24/20 220 lb 3.2 oz (99.9 kg)  06/24/20 216 lb (98 kg)   05/23/20 218 lb 3.2 oz (99 kg)     Lab Results  Component Value Date   WBC 5.7 02/22/2020   HGB 14.0 02/22/2020   HCT 41.5 02/22/2020   PLT 279.0 02/22/2020   GLUCOSE 91 06/24/2020   CHOL 185 06/24/2020   TRIG 243 (H) 06/24/2020   HDL 50 06/24/2020   LDLCALC 98 06/24/2020   ALT 14 06/24/2020   AST 17 06/24/2020   NA 138 06/24/2020   K 4.2 06/24/2020   CL 103 06/24/2020   CREATININE 0.94 06/24/2020   BUN 8 06/24/2020   CO2 27 06/24/2020   TSH 1.21 02/22/2020   HGBA1C 5.7 (H) 06/24/2020    No results found.   Assessment & Plan:  Plan  I am having Sheena Olsen start on cephALEXin. I am also having her maintain her chlorpheniramine, fluticasone, cetirizine, Vitamin D (Ergocalciferol), omeprazole, metFORMIN, cyclobenzaprine, and metoprolol succinate.  Meds ordered this encounter  Medications  . cephALEXin (KEFLEX) 500 MG capsule    Sig: Take 1 capsule (500 mg total) by mouth 2 (two) times daily.    Dispense:  14 capsule    Refill:  0    Problem List Items Addressed This Visit    None    Visit Diagnoses    Abnormal urine odor    -  Primary   Relevant Medications   cephALEXin (KEFLEX) 500 MG capsule   Other Relevant Orders   POCT Urinalysis Dipstick (Automated) (Completed)   Urine Culture (Completed)   Hematuria, unspecified type       Relevant Medications   cephALEXin (KEFLEX) 500 MG capsule   Vaginal odor       Relevant Orders   Cervicovaginal ancillary only( [facility]) (Completed)      Follow-up: Return in about 3 weeks (around 08/14/2020), or if symptoms worsen or fail to improve, for recheck urine.  [name], DO

## 2020-07-24 NOTE — Patient Instructions (Signed)
Vaginitis Vaginitis is a condition in which the vaginal tissue swells and becomes red (inflamed). This condition is most often caused by a change in the normal balance of bacteria and yeast that live in the vagina. This change causes an overgrowth of certain bacteria or yeast, which causes the inflammation. There are different types of vaginitis, but the most common types are:  Bacterial vaginosis.  Yeast infection (candidiasis).  Trichomoniasis vaginitis. This is a sexually transmitted disease (STD).  Viral vaginitis.  Atrophic vaginitis.  Allergic vaginitis. What are the causes? The cause of this condition depends on the type of vaginitis. It can be caused by:  Bacteria (bacterial vaginosis).  Yeast, which is a fungus (yeast infection).  A parasite (trichomoniasis vaginitis).  A virus (viral vaginitis).  Low hormone levels (atrophic vaginitis). Low hormone levels can occur during pregnancy, breastfeeding, or after menopause.  Irritants, such as bubble baths, scented tampons, and feminine sprays (allergic vaginitis). Other factors can change the normal balance of the yeast and bacteria that live in the vagina. These include:  Antibiotic medicines.  Poor hygiene.  Diaphragms, vaginal sponges, spermicides, birth control pills, and intrauterine devices (IUD).  Sex.  Infection.  Uncontrolled diabetes.  A weakened defense (immune) system. What increases the risk? This condition is more likely to develop in women who:  Smoke.  Use vaginal douches, scented tampons, or scented sanitary pads.  Wear tight-fitting pants.  Wear thong underwear.  Use oral birth control pills or an IUD.  Have sex without a condom.  Have multiple sex partners.  Have an STD.  Frequently use the spermicide nonoxynol-9.  Eat lots of foods high in sugar.  Have uncontrolled diabetes.  Have low estrogen levels.  Have a weakened immune system from an immune disorder or medical  treatment.  Are pregnant or breastfeeding. What are the signs or symptoms? Symptoms vary depending on the cause of the vaginitis. Common symptoms include:  Abnormal vaginal discharge. ? The discharge is white, gray, or yellow with bacterial vaginosis. ? The discharge is thick, white, and cheesy with a yeast infection. ? The discharge is frothy and yellow or greenish with trichomoniasis.  A bad vaginal smell. The smell is fishy with bacterial vaginosis.  Vaginal itching, pain, or swelling.  Sex that is painful.  Pain or burning when urinating. Sometimes there are no symptoms. How is this diagnosed? This condition is diagnosed based on your symptoms and medical history. A physical exam, including a pelvic exam, will also be done. You may also have other tests, including:  Tests to determine the pH level (acidity or alkalinity) of your vagina.  A whiff test, to assess the odor that results when a sample of your vaginal discharge is mixed with a potassium hydroxide solution.  Tests of vaginal fluid. A sample will be examined under a microscope. How is this treated? Treatment varies depending on the type of vaginitis you have. Your treatment may include:  Antibiotic creams or pills to treat bacterial vaginosis and trichomoniasis.  Antifungal medicines, such as vaginal creams or suppositories, to treat a yeast infection.  Medicine to ease discomfort if you have viral vaginitis. Your sexual partner should also be treated.  Estrogen delivered in a cream, pill, suppository, or vaginal ring to treat atrophic vaginitis. If vaginal dryness occurs, lubricants and moisturizing creams may help. You may need to avoid scented soaps, sprays, or douches.  Stopping use of a product that is causing allergic vaginitis. Then using a vaginal cream to treat the symptoms. Follow   these instructions at home: Lifestyle  Keep your genital area clean and dry. Avoid soap, and only rinse the area with  water.  Do not douche or use tampons until your health care provider says it is okay to do so. Use sanitary pads, if needed.  Do not have sex until your health care provider approves. When you can return to sex, practice safe sex and use condoms.  Wipe from front to back. This avoids the spread of bacteria from the rectum to the vagina. General instructions  Take over-the-counter and prescription medicines only as told by your health care provider.  If you were prescribed an antibiotic medicine, take or use it as told by your health care provider. Do not stop taking or using the antibiotic even if you start to feel better.  Keep all follow-up visits as told by your health care provider. This is important. How is this prevented?  Use mild, non-scented products. Do not use things that can irritate the vagina, such as fabric softeners. Avoid the following products if they are scented: ? Feminine sprays. ? Detergents. ? Tampons. ? Feminine hygiene products. ? Soaps or bubble baths.  Let air reach your genital area. ? Wear cotton underwear to reduce moisture buildup. ? Avoid wearing underwear while you sleep. ? Avoid wearing tight pants and underwear or nylons without a cotton panel. ? Avoid wearing thong underwear.  Take off any wet clothing, such as bathing suits, as soon as possible.  Practice safe sex and use condoms. Contact a health care provider if:  You have abdominal pain.  You have a fever.  You have symptoms that last for more than 2-3 days. Get help right away if:  You have a fever and your symptoms suddenly get worse. Summary  Vaginitis is a condition in which the vaginal tissue becomes inflamed.This condition is most often caused by a change in the normal balance of bacteria and yeast that live in the vagina.  Treatment varies depending on the type of vaginitis you have.  Do not douche, use tampons , or have sex until your health care provider approves. When  you can return to sex, practice safe sex and use condoms. This information is not intended to replace advice given to you by your health care provider. Make sure you discuss any questions you have with your health care provider. Document Revised: 08/26/2017 Document Reviewed: 10/19/2016 Elsevier Patient Education  2020 Elsevier Inc.  

## 2020-07-25 LAB — CERVICOVAGINAL ANCILLARY ONLY
Bacterial Vaginitis (gardnerella): NEGATIVE
Candida Glabrata: NEGATIVE
Candida Vaginitis: POSITIVE — AB
Comment: NEGATIVE
Comment: NEGATIVE
Comment: NEGATIVE

## 2020-07-25 LAB — URINE CULTURE
MICRO NUMBER:: 11131097
SPECIMEN QUALITY:: ADEQUATE

## 2020-07-27 DIAGNOSIS — R8290 Unspecified abnormal findings in urine: Secondary | ICD-10-CM | POA: Insufficient documentation

## 2020-07-27 NOTE — Assessment & Plan Note (Signed)
Protein and blood in urine Culture and ancillary pending Keflex sent in

## 2020-07-28 ENCOUNTER — Encounter: Payer: Self-pay | Admitting: Family Medicine

## 2020-07-29 ENCOUNTER — Other Ambulatory Visit: Payer: Self-pay

## 2020-07-29 MED ORDER — FLUCONAZOLE 150 MG PO TABS: 150.0000 mg | ORAL_TABLET | Freq: Once | ORAL | 2 refills | Status: AC

## 2020-09-22 ENCOUNTER — Ambulatory Visit: Payer: BC Managed Care – PPO | Attending: Internal Medicine

## 2020-09-22 ENCOUNTER — Other Ambulatory Visit (HOSPITAL_BASED_OUTPATIENT_CLINIC_OR_DEPARTMENT_OTHER): Payer: Self-pay | Admitting: Internal Medicine

## 2020-09-22 DIAGNOSIS — Z23 Encounter for immunization: Secondary | ICD-10-CM

## 2020-09-22 MED FILL — PFIZER-BIONTECH COVID-19 VA: 30 | 21 days supply | Qty: 0 | Fill #0

## 2020-09-22 NOTE — Progress Notes (Signed)
   Covid-19 Vaccination [facility]  Name:  Sheena Olsen    MRN: [id number] DOB: 12/07/1975  09/22/2020  [name] was observed post Covid-19 immunization for 15 minutes without incident. She was provided with Vaccine Information Sheet and instruction to access the V-Safe system.   [name] was instructed to call 911 with any severe reactions post vaccine: Marland Kitchen Difficulty breathing  . Swelling of face and throat  . A fast heartbeat  . A bad rash all over body  . Dizziness and weakness   Immunizations Administered    Name Date Dose VIS Date Route   Pfizer COVID-19 Vaccine 09/22/2020 11:29 AM 0.3 mL 07/16/2020 Intramuscular   Manufacturer: [employer], [employer]   Lot: [id number]   NDC: [id number]

## 2020-10-07 ENCOUNTER — Ambulatory Visit: Payer: BC Managed Care – PPO | Admitting: Medical

## 2020-10-07 ENCOUNTER — Other Ambulatory Visit: Payer: Self-pay

## 2020-10-07 VITALS — BP 120/77 | HR 102 | Temp 98.7°F | Resp 18 | Ht 61.0 in | Wt 210.0 lb

## 2020-10-07 DIAGNOSIS — R591 Generalized enlarged lymph nodes: Secondary | ICD-10-CM | POA: Diagnosis not present

## 2020-10-07 DIAGNOSIS — K1120 Sialoadenitis, unspecified: Secondary | ICD-10-CM | POA: Diagnosis not present

## 2020-10-07 MED ORDER — AMOXICILLIN-POT CLAVULANATE 875-125 MG PO TABS: 1.0000 | ORAL_TABLET | Freq: Two times a day (BID) | ORAL | 0 refills | Status: DC

## 2020-10-07 NOTE — Progress Notes (Signed)
Subjective:    Patient ID: Sheena Olsen, female    DOB: 1975-10-25, 45 y.o.   MRN: [id number]  HPI  Pt in for left side pain in front of ear. Pain present since yesterday. Pain started yesterday. Pain when opens and closes her mouth.   No fevers, no chills or sweats.  No described crepitus in left TMJ region.  No tooth pain. Pt sees dentist every 6 months.    Review of Systems  Constitutional: Negative for chills and fatigue.  HENT:       See HPI.  Respiratory: Negative for cough, chest tightness, shortness of breath and wheezing.   Cardiovascular: Negative for chest pain and palpitations.  Gastrointestinal: Negative for abdominal pain, constipation, nausea and vomiting.  Musculoskeletal: Negative for back pain and myalgias.  Skin: Negative for rash.  Neurological: Negative for dizziness and headaches.  Hematological: Positive for adenopathy. Does not bruise/bleed easily.  Psychiatric/Behavioral: Negative for behavioral problems and confusion.    Past Medical History:  Diagnosis Date  . Allergy   . Migraines    Since age 34  . Uterine fibroid      Social History   Socioeconomic History  . Marital status: Married    Spouse name: Not on file  . Number of children: 1  . Years of education: Masters  . Highest education level: Not on file  Occupational History  . 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 Smoking status: Never Smoker  . Smokeless tobacco: Never Used  Vaping Use  . Vaping Use: Never used  Substance and Sexual Activity  . Alcohol use: Yes    Alcohol/week: 0.0 standard drinks    Comment: Rare  . Drug use: No  . Sexual activity: Not on file  Other Topics Concern  . Not on file  Social History Narrative   Lives with son    Caffeine use: 1 cup coffee per day or tea   Social Determinants of Health   Financial Resource Strain: Not on file  Food Insecurity: Not on file  Transportation Needs: Not on file  Physical Activity: Not on file  Stress:  Not on file  Social Connections: Not on file  Intimate Partner Violence: Not on file    Past Surgical History:  Procedure Laterality Date  . ABDOMINAL HYSTERECTOMY  07/30/2019  . CESAREAN SECTION    . CHOLECYSTECTOMY    . SHOULDER SURGERY Right    Right  . WISDOM TOOTH EXTRACTION  01/25/2020    Family History  Problem Relation Age of Onset  . Diabetes Other   . Hypertension Other   . Alzheimer's disease Other   . Thyroid disease Mother   . Migraines Neg Hx   . Colon cancer Neg Hx   . Esophageal cancer Neg Hx   . Stomach cancer Neg Hx   . Rectal cancer Neg Hx     Allergies  Allergen Reactions  . Other     Pollen, cat  . Cat Hair Extract Itching  . Gramineae Pollens Itching    Current Outpatient Medications on File Prior to Visit  Medication Sig Dispense Refill  . cetirizine (ZYRTEC) 10 MG tablet Take 1 tablet (10 mg total) by mouth daily. 30 tablet 0  . chlorpheniramine (CHLOR-TRIMETON) 4 MG tablet Take 4 mg by mouth 2 (two) times daily as needed for allergies.    . metFORMIN (GLUCOPHAGE-XR) 500 MG 24 hr tablet Take 1 tablet (500 mg total) by mouth daily with breakfast. 90 tablet 1  .  omeprazole (PRILOSEC) 20 MG capsule TAKE 1 CAPSULE BY MOUTH EVERY DAY 90 capsule 5  . Vitamin D, Ergocalciferol, (DRISDOL) 1.25 MG (50000 UNIT) CAPS capsule Take 1 capsule (50,000 Units total) by mouth every 7 (seven) days. 12 capsule 0  . cyclobenzaprine (FLEXERIL) 10 MG tablet Take 1 tablet (10 mg total) by mouth 3 (three) times daily as needed for muscle spasms. (Patient not taking: Reported on 10/07/2020) 30 tablet 0  . fluticasone (FLONASE) 50 MCG/ACT nasal spray Place 2 sprays into both nostrils daily. (Patient not taking: Reported on 10/07/2020) 16 g 0  . metoprolol succinate (TOPROL-XL) 50 MG 24 hr tablet TAKE 1 TABLET (50 MG TOTAL) BY MOUTH DAILY. TAKE WITH OR IMMEDIATELY FOLLOWING A MEAL. (Patient not taking: Reported on 10/07/2020) 90 tablet 1   No current facility-administered  medications on file prior to visit.    BP 120/77   Pulse (!) 102   Temp 98.7 F (37.1 C) (Oral)   Resp 18   Ht 5\' 1"  (1.549 m)   Wt 210 lb (95.3 kg)   SpO2 97%   BMI 39.68 kg/m       Objective:   Physical Exam  General- No acute distress. Pleasant patient. Neck- Full range of motion, no jvd Lungs- Clear, even and unlabored. Heart- regular rate and rhythm. Neurologic- CNII- XII grossly intact. heent-left side of face in front of left ear preauricular area shows moderate swollen area over parotid gland.  She has some mild preauricular tenderness to palpation faint postauricular tenderness and pain just beneath her earlobe.  Small palpable lymph node in area beneath lymph node.  No redness or warmth to parotid region.  Mild faint dullness to left TM.  Canal is not swollen.      Assessment & Plan:  You do have swollen region upper left jaw over parotid area and mild dull tm appearance. Will treat for parotitis and ear infection. Rx augmentin and use ibuprofen over the counter 400-600 mg every 8 hours.  If area changes or worsens let us know.  Follow up up one week or sooner if needed.  Time spent with patient today was 30  minutes which consisted of chart rediew, discussing diagnosis, treatment and documentation.

## 2020-10-07 NOTE — Patient Instructions (Addendum)
You do have swollen region upper left jaw over parotid area and mild dull tm appearance. Will treat for parotitis and ear infection. Rx augmentin and use ibuprofen over the counter 400-600 mg every 8 hours.  If area changes or worsens let us know.  Follow up up one week or sooner if needed.   Parotitis  Parotitis means that you have irritation and swelling (inflammation) in one or both of your parotid glands. These glands make saliva. They are found on each side of your face, below and in front of your earlobes. You may or may not have pain with this condition. What are the causes? This condition may be caused by:  Infections from germs (bacteria or viruses).  Something blocking the flow of saliva through the parotid glands. This can be a stone, scar tissue, or a tumor.  Diseases that cause your body's defense system (immune system) to attack healthy cells in your salivary glands. These are called autoimmune diseases. What increases the risk? You are more likely to get this condition if:  You are 43 years old or older.  You do not drink enough fluids (are dehydrated).  You drink too much alcohol.  You have: ? A dry mouth. ? Diabetes. ? Gout. ? A long-term illness.  You do not take good care of your mouth and teeth (poor dental hygiene).  You have had radiation treatments to the head and neck.  You take certain medicines. What are the signs or symptoms? Symptoms of this condition depend on the cause. They may include:  Swelling under and in front of the ear. This may get worse after you eat.  Redness of the skin over the parotid gland.  Pain and tenderness over the parotid gland. This may get worse after you eat.  Fever or chills.  Pus coming from the ducts inside the mouth.  Dry mouth.  A bad taste in the mouth. How is this treated? Treatment for this condition depends on the cause. Treatment may include:  Antibiotic medicine for an infection from  bacteria.  Drinking more fluids.  Removing a stone or obstruction.  Treating a disease that is causing parotitis.  Surgery to drain an infection, remove a growth, or remove the whole gland. Treatment may not be needed if the swelling goes away with home care. Follow these instructions at home: Medicines  Take over-the-counter and prescription medicines only as told by your doctor.  If you were prescribed an antibiotic medicine, take it as told by your doctor. Do not stop taking the antibiotic even if you start to feel better.   Managing pain and swelling  If told, put heat on the affected area. Do this as often as told by your doctor. Use the heat source that your doctor recommends, such as a moist heat pack or a heating pad. ? Place a towel between your skin and the heat source. ? Leave the heat on for 20-30 minutes. ? Remove the heat if your skin turns bright red. This is very important if you are unable to feel pain, heat, or cold. You may have a greater risk of getting burned.  Gargle with salt water 3-4 times a day or as needed. To make salt water, dissolve -1 tsp (3-6 g) of salt in 1 cup (237 mL) of warm water.  Gently rub your parotid glands as told by your doctor. General instructions  Drink enough fluid to keep your pee (urine) pale yellow.  Keep your mouth clean and moist.  Suck on sour candy. This may help to: ? Make your mouth less dry. ? Make more saliva.  Take good care of your mouth: ? Brush your teeth at least two times a day. ? Floss your teeth every day. ? See your dentist regularly.  Do not use any products that contain nicotine or tobacco. These include cigarettes, e-cigarettes, and chewing tobacco. If you need help quitting, ask your doctor.  Do not drink alcohol.  Keep all follow-up visits as told by your doctor. This is important.   Contact a doctor if:  You have a fever or chills.  You have new symptoms.  Your symptoms get worse.  Your  symptoms do not get better with treatment. Get help right away if:  You have trouble breathing or swallowing. Summary  Parotitis means that you have irritation and swelling (inflammation) in one or both of your parotid glands.  Symptoms include pain and swelling under and in front of the ear.  Treatment for parotitis depends on the cause. In some cases, the condition may go away on its own with home care.  You should drink plenty of fluids, take good care of your mouth, and avoid tobacco products. This information is not intended to replace advice given to you by your health care provider. Make sure you discuss any questions you have with your health care provider. Document Revised: 04/11/2018 Document Reviewed: 04/11/2018 Elsevier Patient Education  [place].

## 2020-10-14 ENCOUNTER — Encounter: Payer: Self-pay | Admitting: Family Medicine

## 2020-10-14 ENCOUNTER — Other Ambulatory Visit: Payer: Self-pay

## 2020-10-14 ENCOUNTER — Ambulatory Visit: Payer: BC Managed Care – PPO | Admitting: Family Medicine

## 2020-10-14 ENCOUNTER — Ambulatory Visit (HOSPITAL_BASED_OUTPATIENT_CLINIC_OR_DEPARTMENT_OTHER)
Admission: RE | Admit: 2020-10-14 | Discharge: 2020-10-14 | Disposition: A | Discharge status: Home / Self Care | Payer: BC Managed Care – PPO | Source: Ambulatory Visit | Attending: Family Medicine | Admitting: Family Medicine

## 2020-10-14 VITALS — BP 120/86 | HR 95 | Temp 99.1°F | Resp 18 | Ht 61.0 in | Wt 209.4 lb

## 2020-10-14 DIAGNOSIS — K118 Other diseases of salivary glands: Secondary | ICD-10-CM | POA: Diagnosis not present

## 2020-10-14 DIAGNOSIS — K1121 Acute sialoadenitis: Secondary | ICD-10-CM | POA: Diagnosis not present

## 2020-10-14 MED ORDER — CEFTRIAXONE SODIUM 1 G IJ SOLR
1.0000 g | Freq: Once | INTRAMUSCULAR | Status: AC
  Administered 2020-10-14: 1 g via INTRAMUSCULAR

## 2020-10-14 MED ORDER — LEVOFLOXACIN 500 MG PO TABS: 500.0000 mg | ORAL_TABLET | Freq: Every day | ORAL | 0 refills | Status: DC

## 2020-10-14 NOTE — Progress Notes (Signed)
Patient ID: Sheena Olsen, female    DOB: 1976/04/12  Age: 45 y.o. MRN: [id number]    Subjective:  Subjective  HPI Sheena Olsen presents for f/u parotiditis    It has improved some but it is still painful.  No other symptoms   Review of Systems  Constitutional: Negative for appetite change, diaphoresis, fatigue and unexpected weight change.  Eyes: Negative for pain, redness and visual disturbance.  Respiratory: Negative for cough, chest tightness, shortness of breath and wheezing.   Cardiovascular: Negative for chest pain, palpitations and leg swelling.  Endocrine: Negative for cold intolerance, heat intolerance, polydipsia, polyphagia and polyuria.  Genitourinary: Negative for difficulty urinating, dysuria and frequency.  Neurological: Negative for dizziness, light-headedness, numbness and headaches.    History Past Medical History:  Diagnosis Date   Allergy    Migraines    Since age 52   Uterine fibroid     She has a past surgical history that includes Cholecystectomy; Shoulder surgery (Right); Cesarean section; Abdominal hysterectomy (07/30/2019); and Wisdom tooth extraction (01/25/2020).   Her family history includes Alzheimer's disease in an other family member; Diabetes in an other family member; Hypertension in an other family member; Thyroid disease in her mother.She reports that she has never smoked. She has never used smokeless tobacco. She reports current alcohol use. She reports that she does not use drugs.  Current Outpatient Medications on File Prior to Visit  Medication Sig Dispense Refill   amoxicillin-clavulanate (AUGMENTIN) 875-125 MG tablet Take 1 tablet by mouth 2 (two) times daily. 20 tablet 0   cetirizine (ZYRTEC) 10 MG tablet Take 1 tablet (10 mg total) by mouth daily. 30 tablet 0   chlorpheniramine (CHLOR-TRIMETON) 4 MG tablet Take 4 mg by mouth 2 (two) times daily as needed for allergies.     metFORMIN (GLUCOPHAGE-XR) 500 MG 24 hr tablet Take 1  tablet (500 mg total) by mouth daily with breakfast. 90 tablet 1   metoprolol succinate (TOPROL-XL) 50 MG 24 hr tablet TAKE 1 TABLET (50 MG TOTAL) BY MOUTH DAILY. TAKE WITH OR IMMEDIATELY FOLLOWING A MEAL. 90 tablet 1   omeprazole (PRILOSEC) 20 MG capsule TAKE 1 CAPSULE BY MOUTH EVERY DAY 90 capsule 5   Vitamin D, Ergocalciferol, (DRISDOL) 1.25 MG (50000 UNIT) CAPS capsule Take 1 capsule (50,000 Units total) by mouth every 7 (seven) days. 12 capsule 0   No current facility-administered medications on file prior to visit.     Objective:  Objective  Physical Exam Vitals and nursing note reviewed.  Constitutional:      Appearance: She is well-developed and well-nourished.  HENT:     Head: Normocephalic and atraumatic.  Eyes:     Extraocular Movements: EOM normal.     Conjunctiva/sclera: Conjunctivae normal.  Neck:     Thyroid: No thyromegaly.     Vascular: No carotid bruit or JVD.     Comments: Swelling L side neck  Cardiovascular:     Rate and Rhythm: Normal rate and regular rhythm.     Heart sounds: Normal heart sounds. No murmur heard.   Pulmonary:     Effort: Pulmonary effort is normal. No respiratory distress.     Breath sounds: Normal breath sounds. No wheezing or rales.  Chest:     Chest wall: No tenderness.  Musculoskeletal:        General: No edema.     Cervical back: Normal range of motion and neck supple.  Neurological:     Mental Status: She is alert  and oriented to person, place, and time.  Psychiatric:        Mood and Affect: Mood and affect normal.    BP 120/86 (BP Location: Right Arm, Patient Position: Sitting, Cuff Size: Large)    Pulse 95    Temp 99.1 F (37.3 C) (Oral)    Resp 18    Ht 5\' 1"  (1.549 m)    Wt 209 lb 6.4 oz (95 kg)    SpO2 98%    BMI 39.57 kg/m  Wt Readings from Last 3 Encounters:  10/14/20 209 lb 6.4 oz (95 kg)  10/07/20 210 lb (95.3 kg)  07/24/20 220 lb 3.2 oz (99.9 kg)     Lab Results  Component Value Date   WBC 5.7 02/22/2020    HGB 14.0 02/22/2020   HCT 41.5 02/22/2020   PLT 279.0 02/22/2020   GLUCOSE 91 06/24/2020   CHOL 185 06/24/2020   TRIG 243 (H) 06/24/2020   HDL 50 06/24/2020   LDLCALC 98 06/24/2020   ALT 14 06/24/2020   AST 17 06/24/2020   NA 138 06/24/2020   K 4.2 06/24/2020   CL 103 06/24/2020   CREATININE 0.94 06/24/2020   BUN 8 06/24/2020   CO2 27 06/24/2020   TSH 1.21 02/22/2020   HGBA1C 5.7 (H) 06/24/2020    US SOFT TISSUE HEAD & NECK (NON-THYROID)  Result Date: 10/14/2020 CLINICAL DATA:  Palpable swelling inferior to left your lobe. Acute parotitis? EXAM: ULTRASOUND OF HEAD/NECK SOFT TISSUES TECHNIQUE: Ultrasound examination of the head and neck soft tissues was performed in the area of clinical concern. COMPARISON:  None. FINDINGS: Targeted sonographic evaluation of the palpable region inferior to the left ear lobe demonstrates multiple ovoid hypoechoic structures with the largest measuring 1.0 cm. The parotid parenchyma is otherwise homogeneous IMPRESSION: Multiple small hypoechoic structures noted in the left parotid gland measuring up to 1.0 cm. These are most likely reactive/inflammatory lymph nodes. A follow-up ultrasound should be performed in 4-6 weeks to document resolution. If the patient's symptoms worsen before than, contrast-enhanced soft tissue neck CT should be performed. Electronically Signed   By: Miachel Roux M.D.   On: 10/14/2020 14:20     Assessment & Plan:  Plan  I have discontinued Kaisey N. Narang's fluticasone and cyclobenzaprine. I am also having her start on levofloxacin. Additionally, I am having her maintain her chlorpheniramine, cetirizine, Vitamin D (Ergocalciferol), omeprazole, metFORMIN, metoprolol succinate, and amoxicillin-clavulanate. We administered cefTRIAXone.  Meds ordered this encounter  Medications   levofloxacin (LEVAQUIN) 500 MG tablet    Sig: Take 1 tablet (500 mg total) by mouth daily.    Dispense:  7 tablet    Refill:  0   cefTRIAXone  (ROCEPHIN) injection 1 g    Problem List Items Addressed This Visit      Unprioritized   Acute parotitis - Primary    Vs enlarged lymph nodes Change abx con't warm compresses Check [place] today  Consider ENT       Relevant Medications   levofloxacin (LEVAQUIN) 500 MG tablet   Other Relevant Orders   US SOFT TISSUE HEAD & NECK (NON-THYROID) (Completed)   CBC with Differential/Platelet   Comprehensive metabolic panel   Ambulatory referral to ENT      Follow-up: Return in about 2 weeks (around 10/28/2020), or if symptoms worsen or fail to improve.  [name], DO

## 2020-10-14 NOTE — Patient Instructions (Signed)
Parotitis  Parotitis is inflammation of one or both of your parotid glands. These glands produce saliva. They are found on each side of your face, below and in front of your earlobes. The saliva that they produce comes out of tiny openings (ducts) inside your cheeks. Parotitis may cause sudden swelling and pain (acute parotitis). It can also cause repeated episodes of swelling and pain or continued swelling that may or may not be painful (chronic parotitis). What are the causes? This condition may be caused by:  Infections from bacteria.  Infections from viruses, such as mumps or HIV.  Blockage (obstruction) of saliva flow through the parotid glands. This can be from a stone, scar tissue, or a tumor.  Diseases that cause your body's defense system (immune system) to attack healthy cells in your salivary glands. These are called autoimmune diseases. What increases the risk? You are more likely to develop this condition if:  You are 50 years old or older.  You do not drink enough fluids (are dehydrated).  You drink too much alcohol.  You have: ? A dry mouth. ? Poor dental hygiene. ? Diabetes. ? Gout. ? A long-term illness.  You have had radiation treatments to the head and neck.  You take certain medicines. What are the signs or symptoms? Symptoms of this condition depend on the cause. Symptoms may include:  Swelling under and in front of the ear. This may get worse after eating.  Redness of the skin over the parotid gland.  Pain and tenderness over the parotid gland. This may get worse after eating.  Fever or chills.  Pus coming from the ducts inside the mouth.  Dry mouth.  A bad taste in the mouth. How is this diagnosed? This condition may be diagnosed based on:  Your medical history.  A physical exam.  Tests to find the cause of the parotitis. These may include: ? Doing blood tests to check for an autoimmune disease or infections from a virus. ? Taking a  fluid sample from the parotid gland and testing it for infection. ? Injecting the ducts of the parotid gland with a dye and then taking X-rays (sialogram). ? Having other imaging tests of the gland, such as X-rays, ultrasound, MRI, or CT scan. ? Checking the opening of the gland for a stone or obstruction. ? Placing a needle into the gland to remove tissue for a biopsy (fine needle aspiration). How is this treated? Treatment for this condition depends on the cause. Treatment may include:  Antibiotic medicine for a bacterial infection.  Drinking more fluids.  Removing a stone or obstruction.  Treating an underlying disease that is causing parotitis.  Surgery to drain an infection, remove a growth, or remove the whole gland (parotidectomy). Treatment may not be needed if parotid swelling goes away with home care. Follow these instructions at home: Medicines  Take over-the-counter and prescription medicines only as told by your health care provider.  If you were prescribed an antibiotic medicine, take it as told by your health care provider. Do not stop taking the antibiotic even if you start to feel better.   Managing pain and swelling  If directed, apply heat to the affected area as often as told by your health care provider. Use the heat source that your health care provider recommends, such as a moist heat pack or a heating pad. To apply the heat: ? Place a towel between your skin and the heat source. ? Leave the heat on for 20-30   minutes. ? Remove the heat if your skin turns bright red. This is especially important if you are unable to feel pain, heat, or cold. You may have a greater risk of getting burned.  Gargle with a salt-water mixture 3-4 times a day or as needed. To make a salt-water mixture, completely dissolve -1 tsp (3-6 g) of salt in 1 cup (237 mL) of warm water.  Gently massage the parotid glands as told by your health care provider. General instructions  Drink  enough fluid to keep your urine pale yellow.  Keep your mouth clean and moist.  Try sucking on sour candy. This may help to make your mouth less dry by stimulating the flow of saliva.  Maintain good oral health. ? Brush your teeth at least two times a day. ? Floss your teeth every day. ? See your dentist regularly.  Do not use any products that contain nicotine or tobacco, such as cigarettes, e-cigarettes, and chewing tobacco. If you need help quitting, ask your health care provider.  Do not drink alcohol.  Keep all follow-up visits as told by your health care provider. This is important.   Contact a health care provider if:  You have a fever or chills.  You have new symptoms.  Your symptoms get worse.  Your symptoms do not improve with treatment. Get help right away if:  You have difficulty breathing or swallowing because of the swollen gland. Summary  Parotitis is inflammation of one or both of your parotid glands.  Symptoms include pain and swelling under and in front of the ear. They may also include a fever and a bad taste in your mouth.  This condition may be treated with antibiotics, increasing fluids, or surgery.  In some cases, parotitis may go away on its own without treatment.  You should drink plenty of fluids, maintain good oral hygiene, and avoid tobacco products. This information is not intended to replace advice given to you by your health care provider. Make sure you discuss any questions you have with your health care provider. Document Revised: 04/11/2018 Document Reviewed: 04/11/2018 Elsevier Patient Education  2021 [employer]  

## 2020-10-15 DIAGNOSIS — K1121 Acute sialoadenitis: Secondary | ICD-10-CM | POA: Insufficient documentation

## 2020-10-15 NOTE — Assessment & Plan Note (Signed)
Vs enlarged lymph nodes Change abx con't warm compresses Check [place] today  Consider ENT

## 2020-10-28 ENCOUNTER — Encounter: Payer: Self-pay | Admitting: Family Medicine

## 2020-10-28 ENCOUNTER — Ambulatory Visit: Payer: BC Managed Care – PPO | Admitting: Family Medicine

## 2020-10-28 ENCOUNTER — Other Ambulatory Visit: Payer: Self-pay

## 2020-10-28 VITALS — BP 100/80 | HR 101 | Temp 98.0°F | Resp 18 | Ht 61.0 in | Wt 208.6 lb

## 2020-10-28 DIAGNOSIS — R599 Enlarged lymph nodes, unspecified: Secondary | ICD-10-CM | POA: Insufficient documentation

## 2020-10-28 NOTE — Patient Instructions (Signed)
Lymphadenopathy  Lymphadenopathy means that your lymph glands are swollen or larger than normal. Lymph glands, also called lymph nodes, are collections of tissue that filter excess fluid, bacteria, viruses, and waste from your bloodstream. They are part of your body's disease-fighting system (immune system), which protects your body from germs. There may be different causes of lymphadenopathy, depending on where it is in your body. Some types go away on their own. Lymphadenopathy can occur anywhere that you have lymph glands, including these areas:  Neck (cervical lymphadenopathy).  Chest (mediastinal lymphadenopathy).  Lungs (hilar lymphadenopathy).  Underarms (axillary lymphadenopathy).  Groin (inguinal lymphadenopathy). When your immune system responds to germs, infection-fighting cells and fluid build up in your lymph glands. This causes some swelling and enlargement. If the lymph nodes do not go back to normal size after you have an infection or disease, your health care provider may do tests. These tests help to monitor your condition and find the reason why the glands are still swollen and enlarged. Follow these instructions at home:  Get plenty of rest.  Your health care provider may recommend over-the-counter medicines for pain. Take over-the-counter and prescription medicines only as told by your health care provider.  If directed, apply heat to swollen lymph glands as often as told by your health care provider. Use the heat source that your health care provider recommends, such as a moist heat pack or a heating pad. ? Place a towel between your skin and the heat source. ? Leave the heat on for 20-30 minutes. ? Remove the heat if your skin turns bright red. This is especially important if you are unable to feel pain, heat, or cold. You may have a greater risk of getting burned.  Check your affected lymph glands every day for changes. Check other lymph gland areas as told by your  health care provider. Check for changes such as: ? More swelling. ? Sudden increase in size. ? Redness or pain. ? Hardness.  Keep all follow-up visits. This is important.   Contact a health care provider if you have:  Lymph glands that: ? Are still swollen after 2 weeks. ? Have suddenly gotten bigger or the swelling spreads. ? Are red, painful, or hard.  Fluid leaking from the skin near an enlarged lymph gland.  Problems with breathing.  A fever, chills, or night sweats.  Fatigue.  A sore throat.  Pain in your abdomen.  Weight loss. Get help right away if you have:  Severe pain.  Chest pain.  Shortness of breath. These symptoms may represent a serious problem that is an emergency. Do not wait to see if the symptoms will go away. Get medical help right away. Call your local emergency services (911 in the [place] Do not drive yourself to the hospital. Summary  Lymphadenopathy means that your lymph glands are swollen or larger than normal.  Lymph glands, also called lymph nodes, are collections of tissue that filter excess fluid, bacteria, viruses, and waste from the bloodstream. They are part of your body's disease-fighting system (immune system).  Lymphadenopathy can occur anywhere that you have lymph glands.  If the lymph nodes do not go back to normal size after you have an infection or disease, your health care provider may do tests to monitor your condition and find the reason why the glands are still swollen and enlarged.  Check your affected lymph glands every day for changes. Check other lymph gland areas as told by your health care provider. This information   is not intended to replace advice given to you by your health care provider. Make sure you discuss any questions you have with your health care provider. Document Revised: 07/09/2020 Document Reviewed: 07/09/2020 Elsevier Patient Education  2021 [employer] Inc.  

## 2020-10-28 NOTE — Progress Notes (Signed)
Patient ID: Sheena Olsen, female    DOB: [birth year]  Age: 45 y.o. MRN: [id number]    Subjective:  Subjective  HPI Sheena Olsen presents for f/u lymph node   She finished the abx.  She said the swelling and pain has resolved but she still wants to see ent   Review of Systems  Constitutional: Negative for appetite change, diaphoresis, fatigue and unexpected weight change.  Eyes: Negative for pain, redness and visual disturbance.  Respiratory: Negative for cough, chest tightness, shortness of breath and wheezing.   Cardiovascular: Negative for chest pain, palpitations and leg swelling.  Endocrine: Negative for cold intolerance, heat intolerance, polydipsia, polyphagia and polyuria.  Genitourinary: Negative for difficulty urinating, dysuria and frequency.  Neurological: Negative for dizziness, light-headedness, numbness and headaches.    History Past Medical History:  Diagnosis Date  . Allergy   . Migraines    Since age 62  . Uterine fibroid     She has a past surgical history that includes Cholecystectomy; Shoulder surgery (Right); Cesarean section; Abdominal hysterectomy (07/30/2019); and Wisdom tooth extraction (01/25/2020).   Her family history includes Alzheimer's disease in an other family member; Diabetes in an other family member; Hypertension in an other family member; Thyroid disease in her mother.She reports that she has never smoked. She has never used smokeless tobacco. She reports current alcohol use. She reports that she does not use drugs.  Current Outpatient Medications on File Prior to Visit  Medication Sig Dispense Refill  . cetirizine (ZYRTEC) 10 MG tablet Take 1 tablet (10 mg total) by mouth daily. 30 tablet 0  . chlorpheniramine (CHLOR-TRIMETON) 4 MG tablet Take 4 mg by mouth 2 (two) times daily as needed for allergies.    . metFORMIN (GLUCOPHAGE-XR) 500 MG 24 hr tablet Take 1 tablet (500 mg total) by mouth daily with breakfast. 90 tablet 1  . omeprazole  (PRILOSEC) 20 MG capsule TAKE 1 CAPSULE BY MOUTH EVERY DAY 90 capsule 5  . Vitamin D, Ergocalciferol, (DRISDOL) 1.25 MG (50000 UNIT) CAPS capsule Take 1 capsule (50,000 Units total) by mouth every 7 (seven) days. 12 capsule 0   No current facility-administered medications on file prior to visit.     Objective:  Objective  Physical Exam Vitals and nursing note reviewed.  Constitutional:      Appearance: She is well-developed and well-nourished.  HENT:     Head: Normocephalic and atraumatic.  Eyes:     Extraocular Movements: EOM normal.     Conjunctiva/sclera: Conjunctivae normal.  Neck:     Thyroid: No thyromegaly.     Vascular: No carotid bruit or JVD.  Cardiovascular:     Rate and Rhythm: Normal rate and regular rhythm.     Heart sounds: Normal heart sounds. No murmur heard.   Pulmonary:     Effort: Pulmonary effort is normal. No respiratory distress.     Breath sounds: Normal breath sounds. No wheezing or rales.  Chest:     Chest wall: No tenderness.  Musculoskeletal:        General: No edema.     Cervical back: Normal range of motion and neck supple.  Lymphadenopathy:     Cervical: No cervical adenopathy.  Neurological:     Mental Status: She is alert and oriented to person, place, and time.  Psychiatric:        Mood and Affect: Mood and affect normal.    BP 100/80 (BP Location: Right Arm, Patient Position: Sitting, Cuff Size: Large)  Pulse (!) 101   Temp 98 F (36.7 C) (Oral)   Resp 18   Ht 5\' 1"  (1.549 m)   Wt 208 lb 9.6 oz (94.6 kg)   SpO2 97%   BMI 39.41 kg/m  Wt Readings from Last 3 Encounters:  10/28/20 208 lb 9.6 oz (94.6 kg)  10/14/20 209 lb 6.4 oz (95 kg)  10/07/20 210 lb (95.3 kg)     Lab Results  Component Value Date   WBC 5.7 02/22/2020   HGB 14.0 02/22/2020   HCT 41.5 02/22/2020   PLT 279.0 02/22/2020   GLUCOSE 91 06/24/2020   CHOL 185 06/24/2020   TRIG 243 (H) 06/24/2020   HDL 50 06/24/2020   LDLCALC 98 06/24/2020   ALT 14  06/24/2020   AST 17 06/24/2020   NA 138 06/24/2020   K 4.2 06/24/2020   CL 103 06/24/2020   CREATININE 0.94 06/24/2020   BUN 8 06/24/2020   CO2 27 06/24/2020   TSH 1.21 02/22/2020   HGBA1C 5.7 (H) 06/24/2020    US SOFT TISSUE HEAD & NECK (NON-THYROID)  Result Date: 10/14/2020 CLINICAL DATA:  Palpable swelling inferior to left your lobe. Acute parotitis? EXAM: ULTRASOUND OF HEAD/NECK SOFT TISSUES TECHNIQUE: Ultrasound examination of the head and neck soft tissues was performed in the area of clinical concern. COMPARISON:  None. FINDINGS: Targeted sonographic evaluation of the palpable region inferior to the left ear lobe demonstrates multiple ovoid hypoechoic structures with the largest measuring 1.0 cm. The parotid parenchyma is otherwise homogeneous IMPRESSION: Multiple small hypoechoic structures noted in the left parotid gland measuring up to 1.0 cm. These are most likely reactive/inflammatory lymph nodes. A follow-up ultrasound should be performed in 4-6 weeks to document resolution. If the patient's symptoms worsen before than, contrast-enhanced soft tissue neck CT should be performed. Electronically Signed   By: Miachel Roux M.D.   On: 10/14/2020 14:20     Assessment & Plan:  Plan  I have discontinued Kortny N. Thain's metoprolol succinate, amoxicillin-clavulanate, and levofloxacin. I am also having her maintain her chlorpheniramine, cetirizine, Vitamin D (Ergocalciferol), omeprazole, and metFORMIN.  No orders of the defined types were placed in this encounter.   Problem List Items Addressed This Visit      Unprioritized   Enlarged lymph node - Primary    Resolved  abx finished [place] to be repeated later this month      Relevant Orders   US SOFT TISSUE HEAD & NECK (NON-THYROID)      Follow-up: No follow-ups on file.  [name], DO

## 2020-10-28 NOTE — Assessment & Plan Note (Signed)
Resolved  abx finished [place] to be repeated later this month

## 2020-10-31 ENCOUNTER — Ambulatory Visit (INDEPENDENT_AMBULATORY_CARE_PROVIDER_SITE_OTHER): Payer: BC Managed Care – PPO | Admitting: Otolaryngology

## 2020-10-31 ENCOUNTER — Other Ambulatory Visit: Payer: Self-pay

## 2020-10-31 ENCOUNTER — Encounter (INDEPENDENT_AMBULATORY_CARE_PROVIDER_SITE_OTHER): Payer: Self-pay | Admitting: Otolaryngology

## 2020-10-31 VITALS — Temp 97.3°F

## 2020-10-31 DIAGNOSIS — Z8719 Personal history of other diseases of the digestive system: Secondary | ICD-10-CM | POA: Diagnosis not present

## 2020-10-31 NOTE — Progress Notes (Signed)
HPI: Sheena Olsen is a 45 y.o. female who presents is referred by her PCP Dr. Cheri Rous for evaluation of left parotid gland infection.  She states that this came on, quickly with swelling of the left cheek and was painful.  She was treated with a couple rounds of antibiotics and the swelling gradually went down and she is doing much better today..  Past Medical History:  Diagnosis Date  . Allergy   . Migraines    Since age 45  . Uterine fibroid    Past Surgical History:  Procedure Laterality Date  . ABDOMINAL HYSTERECTOMY  07/30/2019  . CESAREAN SECTION    . CHOLECYSTECTOMY    . SHOULDER SURGERY Right    Right  . WISDOM TOOTH EXTRACTION  01/25/2020   Social History   Socioeconomic History  . Marital status: Married    Spouse name: Not on file  . Number of children: 1  . Years of education: Masters  . Highest education level: Not on file  Occupational History  . 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 Smoking status: Never Smoker  . Smokeless tobacco: Never Used  Vaping Use  . Vaping Use: Never used  Substance and Sexual Activity  . Alcohol use: Yes    Alcohol/week: 0.0 standard drinks    Comment: Rare  . Drug use: No  . Sexual activity: Not on file  Other Topics Concern  . Not on file  Social History Narrative   Lives with son    Caffeine use: 1 cup coffee per day or tea   Social Determinants of Health   Financial Resource Strain: Not on file  Food Insecurity: Not on file  Transportation Needs: Not on file  Physical Activity: Not on file  Stress: Not on file  Social Connections: Not on file   Family History  Problem Relation Age of Onset  . Diabetes Other   . Hypertension Other   . Alzheimer's disease Other   . Thyroid disease Mother   . Migraines Neg Hx   . Colon cancer Neg Hx   . Esophageal cancer Neg Hx   . Stomach cancer Neg Hx   . Rectal cancer Neg Hx    Allergies  Allergen Reactions  . Other     Pollen, cat  . Cat Hair Extract Itching  .  Gramineae Pollens Itching   Prior to Admission medications   Medication Sig Start Date End Date Taking? Authorizing Provider  cetirizine (ZYRTEC) 10 MG tablet Take 1 tablet (10 mg total) by mouth daily. 12/19/18   Waldon Merl, PA-C  chlorpheniramine (CHLOR-TRIMETON) 4 MG tablet Take 4 mg by mouth 2 (two) times daily as needed for allergies.    [provider]  metFORMIN (GLUCOPHAGE-XR) 500 MG 24 hr tablet Take 1 tablet (500 mg total) by mouth daily with breakfast. 06/19/20   Carollee Herter, Alferd Apa, DO  omeprazole (PRILOSEC) 20 MG capsule TAKE 1 CAPSULE BY MOUTH EVERY DAY 04/15/20   Cirigliano, Vito V, DO  Vitamin D, Ergocalciferol, (DRISDOL) 1.25 MG (50000 UNIT) CAPS capsule Take 1 capsule (50,000 Units total) by mouth every 7 (seven) days. 02/26/20   Roma Schanz R, DO     Positive ROS: Otherwise negative  All other systems have been reviewed and were otherwise negative with the exception of those mentioned in the HPI and as above.  Physical Exam: Constitutional: Alert, well-appearing, no acute distress Ears: External ears without lesions or tenderness. Ear canals are clear bilaterally with  intact, clear TMs.  Nasal: External nose without lesions. Clear nasal passages Oral: Lips and gums without lesions. Tongue and palate mucosa without lesions. Posterior oropharynx clear.  Drainage from the left parotid duct is clear.  There is no palpable stones within the duct region. Neck: No palpable adenopathy or masses.  She has no significant swelling of the left parotid gland and on palpation of the parotid gland is a soft with no palpable nodules or lymphadenopathy. Respiratory: Breathing comfortably  Skin: No facial/neck lesions or rash noted.  Procedures  Assessment: Resolved left parotitis  Plan: She has essentially a normal exam today and no further therapy is needed unless she has any recurrent problems.  She will follow-up as needed.   Radene Journey, MD   CC:

## 2020-11-05 ENCOUNTER — Ambulatory Visit (HOSPITAL_BASED_OUTPATIENT_CLINIC_OR_DEPARTMENT_OTHER)
Admission: RE | Admit: 2020-11-05 | Discharge: 2020-11-05 | Disposition: A | Discharge status: Home / Self Care | Payer: BC Managed Care – PPO | Source: Ambulatory Visit | Attending: Family Medicine | Admitting: Family Medicine

## 2020-11-05 ENCOUNTER — Other Ambulatory Visit: Payer: Self-pay

## 2020-11-05 DIAGNOSIS — R599 Enlarged lymph nodes, unspecified: Secondary | ICD-10-CM | POA: Insufficient documentation

## 2020-11-05 DIAGNOSIS — R590 Localized enlarged lymph nodes: Secondary | ICD-10-CM | POA: Diagnosis not present

## 2020-11-16 ENCOUNTER — Other Ambulatory Visit: Payer: Self-pay | Admitting: Family Medicine

## 2020-12-16 DIAGNOSIS — E8881 Metabolic syndrome: Secondary | ICD-10-CM | POA: Diagnosis not present

## 2020-12-16 DIAGNOSIS — Z23 Encounter for immunization: Secondary | ICD-10-CM | POA: Diagnosis not present

## 2020-12-16 DIAGNOSIS — E669 Obesity, unspecified: Secondary | ICD-10-CM | POA: Diagnosis not present

## 2020-12-16 DIAGNOSIS — R030 Elevated blood-pressure reading, without diagnosis of hypertension: Secondary | ICD-10-CM | POA: Diagnosis not present

## 2020-12-16 DIAGNOSIS — Z6841 Body Mass Index (BMI) 40.0 and over, adult: Secondary | ICD-10-CM | POA: Diagnosis not present

## 2021-01-19 ENCOUNTER — Telehealth: Payer: Self-pay | Admitting: Family Medicine

## 2021-01-19 DIAGNOSIS — I10 Essential (primary) hypertension: Secondary | ICD-10-CM

## 2021-01-19 NOTE — Telephone Encounter (Signed)
I have attempted to call pt to discuss the medication. I wanted to ask her if she was taking this medication. According to the records we have Dc' it on 10/2020.   Calling pt for clarification. No answer so I left a message to call back.

## 2021-01-20 NOTE — Telephone Encounter (Signed)
Noted thanks °

## 2021-01-20 NOTE — Telephone Encounter (Signed)
Patient states she is not taking that medication.

## 2021-01-27 DIAGNOSIS — M19011 Primary osteoarthritis, right shoulder: Secondary | ICD-10-CM | POA: Diagnosis not present

## 2021-01-27 DIAGNOSIS — M25411 Effusion, right shoulder: Secondary | ICD-10-CM | POA: Diagnosis not present

## 2021-01-28 ENCOUNTER — Other Ambulatory Visit: Payer: Self-pay

## 2021-01-28 ENCOUNTER — Telehealth: Payer: Self-pay | Admitting: Medical

## 2021-01-28 ENCOUNTER — Ambulatory Visit (INDEPENDENT_AMBULATORY_CARE_PROVIDER_SITE_OTHER): Payer: BC Managed Care – PPO | Admitting: Medical

## 2021-01-28 ENCOUNTER — Other Ambulatory Visit (HOSPITAL_BASED_OUTPATIENT_CLINIC_OR_DEPARTMENT_OTHER): Payer: Self-pay

## 2021-01-28 VITALS — BP 123/81 | HR 98 | Resp 20 | Ht 61.0 in | Wt 213.0 lb

## 2021-01-28 DIAGNOSIS — R319 Hematuria, unspecified: Secondary | ICD-10-CM | POA: Diagnosis not present

## 2021-01-28 DIAGNOSIS — R300 Dysuria: Secondary | ICD-10-CM

## 2021-01-28 LAB — POC URINALSYSI DIPSTICK (AUTOMATED)
Bilirubin, UA: NEGATIVE
Blood, UA: POSITIVE
Glucose, UA: NEGATIVE
Ketones, UA: POSITIVE
Leukocytes, UA: NEGATIVE
Nitrite, UA: NEGATIVE
Protein, UA: POSITIVE — AB
Spec Grav, UA: 1.025
Urobilinogen, UA: 0.2 U/dL
pH, UA: 6

## 2021-01-28 MED ORDER — PHENAZOPYRIDINE HCL 200 MG PO TABS
200.0000 mg | ORAL_TABLET | Freq: Three times a day (TID) | ORAL | 0 refills | Status: DC | PRN
  Filled 2021-01-28: qty 10, 4d supply, fill #0

## 2021-01-28 MED ORDER — NITROFURANTOIN MONOHYD MACRO 100 MG PO CAPS
100.0000 mg | ORAL_CAPSULE | Freq: Two times a day (BID) | ORAL | 0 refills | Status: DC
  Filled 2021-01-28: qty 14, 7d supply, fill #0

## 2021-01-28 NOTE — Patient Instructions (Signed)
Your appear to have a urinary tract infection. I am prescribing  macrobid antibiotic for the probable infection. Will rx pyridium for urinary pain.  Hydrate well. I am sending out a urine culture. During the interim if your signs and symptoms worsen rather than improving please notify us. We will notify your when the culture results are back.  Repeat urine 2 weeks to recheck for any blood.  Follow up in 7 days or as needed.

## 2021-01-28 NOTE — Progress Notes (Signed)
Subjective:    Patient ID: Sheena Olsen, female    DOB: 06/27/1976, 45 y.o.   MRN: [id number]  HPI  Pt in today reporting urinary symptoms x 1 day..  Dysuria- yes Frequent urination-yes. Hesitancy-some. Suprapubic pressure-yes. Fever-no chills-no Nausea-no Vomiting-no CVA pain-none History of UTI-yes. Over last year 3 uti. Gross hematuria-.no.  Pt had hysterectomy November 2020.  No history of smoking.   Review of Systems  Constitutional: Negative for chills, fatigue and fever.  Respiratory: Negative for cough, chest tightness, shortness of breath and wheezing.   Cardiovascular: Negative for chest pain and palpitations.  Gastrointestinal: Negative for abdominal pain, blood in stool, constipation and nausea.  Genitourinary: Positive for dysuria, frequency and urgency.  Musculoskeletal: Negative for back pain.  Skin: Negative for rash.  Neurological: Negative for dizziness, weakness and headaches.  Hematological: Negative for adenopathy. Does not bruise/bleed easily.  Psychiatric/Behavioral: Negative for behavioral problems. The patient is not nervous/anxious.     Past Medical History:  Diagnosis Date  . Allergy   . Migraines    Since age 58  . Uterine fibroid      Social History   Socioeconomic History  . Marital status: Married    Spouse name: Not on file  . Number of children: 1  . Years of education: Masters  . Highest education level: Not on file  Occupational History  . 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 Smoking status: Never Smoker  . Smokeless tobacco: Never Used  Vaping Use  . Vaping Use: Never used  Substance and Sexual Activity  . Alcohol use: Yes    Alcohol/week: 0.0 standard drinks    Comment: Rare  . Drug use: No  . Sexual activity: Not on file  Other Topics Concern  . Not on file  Social History Narrative   Lives with son    Caffeine use: 1 cup coffee per day or tea   Social Determinants of Health   Financial Resource  Strain: Not on file  Food Insecurity: Not on file  Transportation Needs: Not on file  Physical Activity: Not on file  Stress: Not on file  Social Connections: Not on file  Intimate Partner Violence: Not on file    Past Surgical History:  Procedure Laterality Date  . ABDOMINAL HYSTERECTOMY  07/30/2019  . CESAREAN SECTION    . CHOLECYSTECTOMY    . SHOULDER SURGERY Right    Right  . WISDOM TOOTH EXTRACTION  01/25/2020    Family History  Problem Relation Age of Onset  . Diabetes Other   . Hypertension Other   . Alzheimer's disease Other   . Thyroid disease Mother   . Migraines Neg Hx   . Colon cancer Neg Hx   . Esophageal cancer Neg Hx   . Stomach cancer Neg Hx   . Rectal cancer Neg Hx     Allergies  Allergen Reactions  . Other     Pollen, cat  . Cat Hair Extract Itching  . Gramineae Pollens Itching    Current Outpatient Medications on File Prior to Visit  Medication Sig Dispense Refill  . cetirizine (ZYRTEC) 10 MG tablet Take 1 tablet (10 mg total) by mouth daily. 30 tablet 0  . chlorpheniramine (CHLOR-TRIMETON) 4 MG tablet Take 4 mg by mouth 2 (two) times daily as needed for allergies.    Marland Kitchen COVID-19 mRNA vaccine, Pfizer, 30 MCG/0.3ML injection INJECT AS DIRECTED .3 mL 0  . metFORMIN (GLUCOPHAGE-XR) 500 MG 24 hr tablet TAKE 1  TABLET BY MOUTH EVERY DAY WITH BREAKFAST 90 tablet 1  . omeprazole (PRILOSEC) 20 MG capsule TAKE 1 CAPSULE BY MOUTH EVERY DAY 90 capsule 5  . Vitamin D, Ergocalciferol, (DRISDOL) 1.25 MG (50000 UNIT) CAPS capsule Take 1 capsule (50,000 Units total) by mouth every 7 (seven) days. 12 capsule 0   No current facility-administered medications on file prior to visit.    BP 123/81   Pulse 98   Resp 20   Ht 5\' 1"  (1.549 m)   Wt 213 lb (96.6 kg)   SpO2 99%   BMI 40.25 kg/m       Objective:   Physical Exam  General- No acute distress. Pleasant patient. Neck- Full range of motion, no jvd Lungs- Clear, even and unlabored. Heart- regular rate  and rhythm. Neurologic- CNII- XII grossly intact. Back- no cva tenderness.      Assessment & Plan:  Your appear to have a urinary tract infection. I am prescribing  macrobid antibiotic for the probable infection. Will rx pyridium for urinary pain.  Hydrate well. I am sending out a urine culture. During the interim if your signs and symptoms worsen rather than improving please notify us. We will notify your when the culture results are back.  Repeat urine 2 weeks to recheck for any blood.  Follow up in 7 days or as needed.  Mackie Pai, PA-C

## 2021-01-28 NOTE — Telephone Encounter (Signed)
Future urine order placed. Check for blood post uti treatment.

## 2021-01-30 LAB — URINE CULTURE
MICRO NUMBER:: 11849339
SPECIMEN QUALITY:: ADEQUATE

## 2021-02-02 ENCOUNTER — Encounter: Payer: Self-pay | Admitting: Medical

## 2021-02-06 DIAGNOSIS — Z01419 Encounter for gynecological examination (general) (routine) without abnormal findings: Secondary | ICD-10-CM | POA: Diagnosis not present

## 2021-02-13 ENCOUNTER — Other Ambulatory Visit: Payer: Self-pay

## 2021-02-13 ENCOUNTER — Other Ambulatory Visit: Payer: BC Managed Care – PPO

## 2021-02-13 DIAGNOSIS — R319 Hematuria, unspecified: Secondary | ICD-10-CM

## 2021-02-13 DIAGNOSIS — E559 Vitamin D deficiency, unspecified: Secondary | ICD-10-CM

## 2021-02-13 NOTE — Addendum Note (Signed)
Addended by: Kelle Darting A on: 02/13/2021 02:16 PM   Modules accepted: Orders

## 2021-02-13 NOTE — Progress Notes (Signed)
Pt was here for future lab orders. Was only aware of urine test today. Future vit d was released in error and cancelled today; reordered as future and pt will get clarification if test is still needed.

## 2021-02-14 LAB — URINALYSIS, ROUTINE W REFLEX MICROSCOPIC
Bacteria, UA: NONE SEEN /HPF
Bilirubin Urine: NEGATIVE
Glucose, UA: NEGATIVE
Hyaline Cast: NONE SEEN /LPF
Ketones, ur: NEGATIVE
Leukocytes,Ua: NEGATIVE
Nitrite: NEGATIVE
Protein, ur: NEGATIVE
RBC / HPF: NONE SEEN /HPF (ref 0–2)
Specific Gravity, Urine: 1.017 (ref 1.001–1.035)
pH: 5.5 (ref 5.0–8.0)

## 2021-02-14 NOTE — Addendum Note (Signed)
Addended by: Anabel Halon on: 02/14/2021 06:50 PM   Modules accepted: Orders

## 2021-03-16 ENCOUNTER — Ambulatory Visit (INDEPENDENT_AMBULATORY_CARE_PROVIDER_SITE_OTHER): Payer: BC Managed Care – PPO | Admitting: Family Medicine

## 2021-03-16 ENCOUNTER — Other Ambulatory Visit: Payer: Self-pay

## 2021-03-16 ENCOUNTER — Encounter: Payer: Self-pay | Admitting: Family Medicine

## 2021-03-16 VITALS — BP 110/80 | HR 104 | Temp 98.8°F | Resp 18 | Ht 61.0 in | Wt 218.2 lb

## 2021-03-16 DIAGNOSIS — N390 Urinary tract infection, site not specified: Secondary | ICD-10-CM

## 2021-03-16 DIAGNOSIS — R319 Hematuria, unspecified: Secondary | ICD-10-CM | POA: Diagnosis not present

## 2021-03-16 DIAGNOSIS — R300 Dysuria: Secondary | ICD-10-CM

## 2021-03-16 LAB — POC URINALSYSI DIPSTICK (AUTOMATED)
Bilirubin, UA: NEGATIVE
Glucose, UA: NEGATIVE
Ketones, UA: NEGATIVE
Nitrite, UA: POSITIVE
Protein, UA: NEGATIVE
Spec Grav, UA: 1.01 (ref 1.010–1.025)
Urobilinogen, UA: 0.2 E.U./dL
pH, UA: 7.5 (ref 5.0–8.0)

## 2021-03-16 MED ORDER — NITROFURANTOIN MONOHYD MACRO 100 MG PO CAPS: 100.0000 mg | ORAL_CAPSULE | Freq: Two times a day (BID) | ORAL | 0 refills | Status: DC

## 2021-03-16 MED ORDER — NITROFURANTOIN MACROCRYSTAL 50 MG PO CAPS: ORAL_CAPSULE | ORAL | 2 refills | Status: DC

## 2021-03-16 NOTE — Progress Notes (Signed)
Patient ID: Sheena Olsen, female    DOB: 17-Jun-1976  Age: 45 y.o. MRN: [id number]    Subjective:  Subjective  HPI Sheena Olsen presents for an office visit today. She complains of burning sensation while urinating, frequency, hematuria, and dysuria. She notes that yesterday the symptoms are unbearable where she is considering going  to the ED. She reports that today, there is no blood present in the urine. She endorses taking 200 mg of pyridium PO Daily to help relived her pain. Pt has a PMHx of UTI where in 2021 she had 3 UTI consecutively. She states that she experiences UTI after intercourse.   She denies any chest pain, SOB, fever, abdominal pain, cough, chills, sore throat,  back pain, HA, or N/V/D at this time.  She taking azo and also has pyridium at home   Review of Systems  Constitutional:  Negative for chills, fatigue and fever.  HENT:  Negative for ear pain, rhinorrhea, sinus pressure, sinus pain, sore throat and tinnitus.   Eyes:  Negative for pain.  Respiratory:  Negative for cough, shortness of breath and wheezing.   Cardiovascular:  Negative for chest pain.  Gastrointestinal:  Negative for abdominal pain, anal bleeding, constipation, diarrhea, nausea and vomiting.  Genitourinary:  Positive for dysuria ([place] and pain), frequency and hematuria.  Musculoskeletal:  Negative for back pain and neck pain.  Skin:  Negative for rash.  Neurological:  Negative for seizures, weakness, light-headedness, numbness and headaches.   History Past Medical History:  Diagnosis Date   Allergy    Migraines    Since age 25   Uterine fibroid     She has a past surgical history that includes Cholecystectomy; Shoulder surgery (Right); Cesarean section; Abdominal hysterectomy (07/30/2019); and Wisdom tooth extraction (01/25/2020).   Her family history includes Alzheimer's disease in an other family member; Diabetes in an other family member; Hypertension in an other family member; Thyroid  disease in her mother.She reports that she has never smoked. She has never used smokeless tobacco. She reports current alcohol use. She reports that she does not use drugs.  Current Outpatient Medications on File Prior to Visit  Medication Sig Dispense Refill   cetirizine (ZYRTEC) 10 MG tablet Take 1 tablet (10 mg total) by mouth daily. 30 tablet 0   chlorpheniramine (CHLOR-TRIMETON) 4 MG tablet Take 4 mg by mouth 2 (two) times daily as needed for allergies.     metFORMIN (GLUCOPHAGE-XR) 500 MG 24 hr tablet TAKE 1 TABLET BY MOUTH EVERY DAY WITH BREAKFAST 90 tablet 1   omeprazole (PRILOSEC) 20 MG capsule TAKE 1 CAPSULE BY MOUTH EVERY DAY 90 capsule 5   phenazopyridine (PYRIDIUM) 200 MG tablet Take 1 tablet (200 mg total) by mouth 3 (three) times daily as needed for pain. 10 tablet 0   No current facility-administered medications on file prior to visit.     Objective:  Objective  Physical Exam Vitals and nursing note reviewed.  Constitutional:      General: She is not in acute distress.    Appearance: Normal appearance. She is well-developed. She is not ill-appearing.  HENT:     Head: Normocephalic and atraumatic.     Right Ear: External ear normal.     Left Ear: External ear normal.     Nose: Nose normal.  Eyes:     General:        Right eye: No discharge.        Left eye: No discharge.  Extraocular Movements: Extraocular movements intact.     Pupils: Pupils are equal, round, and reactive to light.  Cardiovascular:     Rate and Rhythm: Normal rate and regular rhythm.     Pulses: Normal pulses.     Heart sounds: Normal heart sounds. No murmur heard.   No friction rub. No gallop.  Pulmonary:     Effort: Pulmonary effort is normal. No respiratory distress.     Breath sounds: Normal breath sounds. No stridor. No wheezing, rhonchi or rales.  Chest:     Chest wall: No tenderness.  Abdominal:     General: Bowel sounds are normal. There is no distension.     Palpations: Abdomen  is soft. There is no mass.     Tenderness: There is abdominal tenderness in the suprapubic area. There is no guarding or rebound.     Hernia: No hernia is present.     Comments: There is tenderness present in the suprapubic region.   Musculoskeletal:        General: Normal range of motion.     Cervical back: Normal range of motion and neck supple.     Right lower leg: No edema.     Left lower leg: No edema.  Skin:    General: Skin is warm and dry.  Neurological:     Mental Status: She is alert and oriented to person, place, and time.  Psychiatric:        Behavior: Behavior normal.        Thought Content: Thought content normal.   BP 110/80 (BP Location: Right Arm, Patient Position: Sitting, Cuff Size: Large)   Pulse (!) 104   Temp 98.8 F (37.1 C) (Oral)   Resp 18   Ht 5\' 1"  (1.549 m)   Wt 218 lb 3.2 oz (99 kg)   SpO2 97%   BMI 41.23 kg/m  Wt Readings from Last 3 Encounters:  03/16/21 218 lb 3.2 oz (99 kg)  01/28/21 213 lb (96.6 kg)  10/28/20 208 lb 9.6 oz (94.6 kg)     Lab Results  Component Value Date   WBC 5.7 02/22/2020   HGB 14.0 02/22/2020   HCT 41.5 02/22/2020   PLT 279.0 02/22/2020   GLUCOSE 91 06/24/2020   CHOL 185 06/24/2020   TRIG 243 (H) 06/24/2020   HDL 50 06/24/2020   LDLCALC 98 06/24/2020   ALT 14 06/24/2020   AST 17 06/24/2020   NA 138 06/24/2020   K 4.2 06/24/2020   CL 103 06/24/2020   CREATININE 0.94 06/24/2020   BUN 8 06/24/2020   CO2 27 06/24/2020   TSH 1.21 02/22/2020   HGBA1C 5.7 (H) 06/24/2020    US SOFT TISSUE HEAD & NECK (NON-THYROID)  Result Date: 11/05/2020 CLINICAL DATA:  Follow-up examination of lymph nodes in left neck. EXAM: ULTRASOUND OF HEAD/NECK SOFT TISSUES TECHNIQUE: Ultrasound examination of the head and neck soft tissues was performed in the area of clinical concern. COMPARISON:  Previous ultrasound from 10/14/2020 FINDINGS: Targeted ultrasound of the previously identified intraparotid lymph nodes within the left parotid  gland was performed. Multiple scattered hypoechoic structures again seen within the left parotid gland, consistent with small intraparotid lymph nodes. Overall, these are decreased in prominence as compared to previous exam. For example, 1 node positioned inferior to the left ear appears smaller now measuring 4 mm in short axis, previously 6 mm. None of these nodes are enlarged by size criteria. A normal preserved fatty hilum seen in 1 of these  lesions (image 16). The surrounding parotid parenchyma is fairly homogeneous with no other discrete mass, collection, or acute inflammatory changes. IMPRESSION: Interval decrease in size and prominence of previously identified left intraparotid lymph nodes, suggesting that these were likely reactive/inflammatory in nature. No enlarged or pathologic appearing nodes seen on today's exam. Electronically Signed   By: Jeannine Boga M.D.   On: 11/05/2020 19:04     Assessment & Plan:  Plan   Meds ordered this encounter  Medications   nitrofurantoin, macrocrystal-monohydrate, (MACROBID) 100 MG capsule    Sig: Take 1 capsule (100 mg total) by mouth 2 (two) times daily.    Dispense:  14 capsule    Refill:  0   nitrofurantoin (MACRODANTIN) 50 MG capsule    Sig: 1 po after intercourse -- max 1 po qd    Dispense:  15 capsule    Refill:  2    Problem List Items Addressed This Visit       Unprioritized   Urinary tract infection with hematuria - Primary    macrobid 100 mg bid x 7 days Then 50 mg post coital  F/u urology       Relevant Medications   nitrofurantoin, macrocrystal-monohydrate, (MACROBID) 100 MG capsule   nitrofurantoin (MACRODANTIN) 50 MG capsule   Other Relevant Orders   Urine Culture   Other Visit Diagnoses     Dysuria       Relevant Orders   POCT Urinalysis Dipstick (Automated) (Completed)       Follow-up: Return if symptoms worsen or fail to improve.   I,Gordon Zheng,acting as a Education administrator for [employer], DO.,have  documented all relevant documentation on the behalf of Sheena Held, DO,as directed by  Sheena Held, DO while in the presence of [place], DO, have reviewed all documentation for this visit. The documentation on 03/16/21 for the exam, diagnosis, procedures, and orders are all accurate and complete.

## 2021-03-16 NOTE — Assessment & Plan Note (Signed)
macrobid 100 mg bid x 7 days Then 50 mg post coital  F/u urology

## 2021-03-16 NOTE — Patient Instructions (Signed)

## 2021-03-19 LAB — URINE CULTURE
MICRO NUMBER:: 12026857
SPECIMEN QUALITY:: ADEQUATE

## 2021-04-10 DIAGNOSIS — R8271 Bacteriuria: Secondary | ICD-10-CM | POA: Diagnosis not present

## 2021-04-10 DIAGNOSIS — R350 Frequency of micturition: Secondary | ICD-10-CM | POA: Diagnosis not present

## 2021-04-17 DIAGNOSIS — R310 Gross hematuria: Secondary | ICD-10-CM | POA: Diagnosis not present

## 2021-05-07 ENCOUNTER — Other Ambulatory Visit: Payer: Self-pay | Admitting: Gastroenterology

## 2021-05-07 DIAGNOSIS — A048 Other specified bacterial intestinal infections: Secondary | ICD-10-CM

## 2021-05-19 ENCOUNTER — Other Ambulatory Visit: Payer: Self-pay | Admitting: Family Medicine

## 2021-08-03 ENCOUNTER — Other Ambulatory Visit: Payer: Self-pay | Admitting: Gastroenterology

## 2021-08-03 DIAGNOSIS — A048 Other specified bacterial intestinal infections: Secondary | ICD-10-CM

## 2021-08-06 ENCOUNTER — Other Ambulatory Visit: Payer: Self-pay

## 2021-08-06 ENCOUNTER — Encounter: Payer: Self-pay | Admitting: Family Medicine

## 2021-08-06 ENCOUNTER — Ambulatory Visit (INDEPENDENT_AMBULATORY_CARE_PROVIDER_SITE_OTHER): Payer: BC Managed Care – PPO | Admitting: Family Medicine

## 2021-08-06 VITALS — BP 100/84 | HR 71 | Temp 97.8°F | Resp 18 | Ht 61.0 in | Wt 205.8 lb

## 2021-08-06 DIAGNOSIS — S161XXA Strain of muscle, fascia and tendon at neck level, initial encounter: Secondary | ICD-10-CM

## 2021-08-06 DIAGNOSIS — R5383 Other fatigue: Secondary | ICD-10-CM

## 2021-08-06 MED ORDER — CYCLOBENZAPRINE HCL 10 MG PO TABS
10.0000 mg | ORAL_TABLET | Freq: Three times a day (TID) | ORAL | 0 refills | Status: DC | PRN
Start: 2021-08-06 — End: 2022-07-15

## 2021-08-06 NOTE — Assessment & Plan Note (Signed)
Muscle relaxer  Ice  Massage  rto prn

## 2021-08-06 NOTE — Patient Instructions (Signed)
Cervical Sprain A cervical sprain is a stretch or tear in one or more of the ligaments in the neck. Ligaments are the tissues that connect bones. Cervical sprains can range from mild to severe. Severe cervical sprains can cause the spinal bones (vertebrae) in the neck to be unstable. This can result in spinal cord damage and in serious nervous system problems. The time that it takes for a cervical sprain to heal depends on the cause and extent of the injury. Most cervical sprains heal in 4-6 weeks. What are the causes? Cervical sprains may be caused by trauma, such as an injury from a motor vehicle accident, a fall, or a sudden forward and backward whipping movement of the head and neck (whiplash injury). Mild cervical sprains may be caused by wear and tear over time. What increases the risk? The following factors may make you more likely to develop this condition: Participating in activities that have a high risk of trauma to the neck. These include contact sports, auto racing, gymnastics, and diving. Taking risks when driving or riding in a motor vehicle. Osteoarthritis of the spine. Poor strength and flexibility of the neck. A previous neck injury. Poor posture. Spending long periods in certain positions that put stress on the neck, such as sitting at a computer for a long time. What are the signs or symptoms? Symptoms of this condition include: Pain, soreness, stiffness, tenderness, swelling, or a burning sensation in the front, back, or sides of the neck, shoulders, or upper back. Sudden tightening of neck muscles (spasms). Limited ability to move the neck. Headache. Dizziness. Nausea or vomiting. Weakness, numbness, or tingling in a hand or an arm. Symptoms may develop right away after injury, or they may develop over a few days. In some cases, symptoms may go away with treatment and return (recur) over time. How is this diagnosed? This condition may be diagnosed based on: Your  medical history. Your symptoms. Any recent injuries or known neck problems that you have, such as arthritis in the neck. A physical exam. Imaging tests, such as X-rays, MRI, and CT scan. How is this treated? This condition is treated by resting and icing the injured area and doing physical therapy exercises. Heat therapy may be used 2-3 days after the injury occurred if there is no swelling. Depending on the severity of your condition, treatment may also include: Keeping your neck in place (immobilized) for periods of time. This may be done using: A cervical collar. This supports your chin and the back of your head. A cervical traction device. This is a sling that holds up your head. The device removes weight and pressure from your neck, and it may help to relieve pain. Medicines that help to relieve pain and inflammation. Medicines that help to relax your muscles (muscle relaxants). Surgery. This is rare. Follow these instructions at home: Medicines  Take over-the-counter and prescription medicines only as told by your health care provider. Ask your health care provider if the medicine prescribed to you: Requires you to avoid driving or using heavy machinery. Can cause constipation. You may need to take these actions to prevent or treat constipation: Drink enough fluid to keep your urine pale yellow. Take over-the-counter or prescription medicines. Eat foods that are high in fiber, such as beans, whole grains, and fresh fruits and vegetables. Limit foods that are high in fat and processed sugars, such as fried or sweet foods. If you have a cervical collar: Wear the collar as told by your   health care provider. Do not remove it unless told. Ask before making any adjustments to your collar. If you have long hair, keep it outside of the collar. Ask your health care provider if you may remove the collar for cleaning and bathing. If so: Follow instructions about how to remove it  safely. Clean it by hand with mild soap and water and air-dry it completely. If your collar has removable pads, remove them every 1-2 days and wash them by hand with soap and water. Let them air-dry completely before putting them back in the collar. Tell your health care provider if your skin under the collar has irritation or sores. Managing pain, stiffness, and swelling   If directed, use a cervical traction device as told. If directed, put ice on the affected area. To do this: Put ice in a plastic bag. Place a towel between your skin and the bag. Leave the ice on for 20 minutes, 2-3 times a day. If directed, apply heat to the affected area before you do your physical therapy or as often as told by your health care provider. Use the heat source that your health care provider recommends, such as a moist heat pack or a heating pad. Place a towel between your skin and the heat source. Leave the heat on for 20-30 minutes. Remove the heat if your skin turns bright red. This is especially important if you are unable to feel pain, heat, or cold. You may have a greater risk of getting burned. Activity Do not drive while wearing a cervical collar. If you do not have a cervical collar, ask if it is safe to drive while your neck heals. Do not lift anything that is heavier than 10 lb (4.5 kg), or the limit that you are told, until your health care provider says that it is safe. Rest as told by your health care provider. If physical therapy was prescribed, do exercises as told by your health care provider or physical therapist. Return to your normal activities as told by your health care provider. Avoid positions and activities that make your symptoms worse. Ask your health care provider what activities are safe for you. General instructions Do not use any products that contain nicotine or tobacco, such as cigarettes, e-cigarettes, and chewing tobacco. These can delay healing. If you need help quitting,  ask your health care provider. Keep all follow-up visits as told by your health care provider or physical therapist. This is important. How is this prevented? To prevent a cervical sprain from happening again: Use and maintain good posture. Make any needed adjustments to your workstation to help you do this. Exercise regularly as told by your health care provider or physical therapist. Avoid risky activities that may cause a cervical sprain. Contact a health care provider if you have: Symptoms that get worse or do not get better after 2 weeks of treatment. Pain that gets worse or does not get better with medicine. New, unexplained symptoms. Sores or irritated skin on your neck from wearing your cervical collar. Get help right away if: You have severe pain. You develop numbness, tingling, or weakness in any part of your body. You cannot move a part of your body (you have paralysis). You have neck pain along with severe dizziness or headache. Summary A cervical sprain is a stretch or tear in one or more of the ligaments in the neck. Cervical sprains may be caused by trauma, such as an injury from a motor vehicle accident, a   fall, or a sudden forward and backward whipping movement of the head and neck (whiplash injury). Symptoms may develop right away after injury, or they may develop over a few days. This condition may be treated with rest, ice, heat, medicines, physical therapy, and surgery. This information is not intended to replace advice given to you by your health care provider. Make sure you discuss any questions you have with your health care provider. Document Revised: 05/23/2019 Document Reviewed: 05/23/2019 Elsevier Patient Education  2022 [employer]  

## 2021-08-06 NOTE — Progress Notes (Signed)
Subjective:   By signing my name below, I, [name], attest that this documentation has been prepared under the direction and in the presence of Dr. Roma Schanz, DO. 08/06/2021    Patient ID: Sheena Olsen, female    DOB: 1976-03-24, 45 y.o.   MRN: [id number]  Chief Complaint  Patient presents with   Neck Pain    X2 weeks, Pt states having neck pain going into both shoulders. Pt states no falls.     HPI Patient is in today for a office visit.   Neck pain- She complains of neck pain for the past couple of days. Her pain started after she woke up in the morning. She is not sleeping on a new bed or had any other prior injury. Her pain starts the the middle of her posterior neck and radiates to her trapezius. She denies having and numbness or tingling in her fingers. She has tried neck stretches, shoulder rolls, shrugs exercise to manage her pain and is finding mild relief. She reports recently being in physical therapy a couple of months ago for shoulder pain. She has not tried dry needling for physical therapy.  Immunizations- She is not interested in receiving a flu vaccine during this visit.    Past Medical History:  Diagnosis Date   Allergy    Migraines    Since age 22   Uterine fibroid     Past Surgical History:  Procedure Laterality Date   ABDOMINAL HYSTERECTOMY  07/30/2019   CESAREAN SECTION     CHOLECYSTECTOMY     SHOULDER SURGERY Right    Right   WISDOM TOOTH EXTRACTION  01/25/2020    Family History  Problem Relation Age of Onset   Diabetes Other    Hypertension Other    Alzheimer's disease Other    Thyroid disease Mother    Migraines Neg Hx    Colon cancer Neg Hx    Esophageal cancer Neg Hx    Stomach cancer Neg Hx    Rectal cancer Neg Hx     Social History   Socioeconomic History   Marital status: Married    Spouse name: Not on file   Number of children: 1   Years of education: Masters   Highest education level: Not on file   Occupational History   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  Tobacco Use   Smoking status: Never   Smokeless tobacco: Never  Vaping Use   Vaping Use: Never used  Substance and Sexual Activity   Alcohol use: Yes    Alcohol/week: 0.0 standard drinks    Comment: Rare   Drug use: No   Sexual activity: Not on file  Other Topics Concern   Not on file  Social History Narrative   Lives with son    Caffeine use: 1 cup coffee per day or tea   Social Determinants of Health   Financial Resource Strain: Not on file  Food Insecurity: Not on file  Transportation Needs: Not on file  Physical Activity: Not on file  Stress: Not on file  Social Connections: Not on file  Intimate Partner Violence: Not on file    Outpatient Medications Prior to Visit  Medication Sig Dispense Refill   cetirizine (ZYRTEC) 10 MG tablet Take 1 tablet (10 mg total) by mouth daily. 30 tablet 0   chlorpheniramine (CHLOR-TRIMETON) 4 MG tablet Take 4 mg by mouth 2 (two) times daily as needed for allergies.     metFORMIN (GLUCOPHAGE-XR) 500 MG 24 hr  tablet TAKE 1 TABLET BY MOUTH EVERY DAY WITH BREAKFAST 90 tablet 1   nitrofurantoin (MACRODANTIN) 50 MG capsule 1 po after intercourse -- max 1 po qd 15 capsule 2   omeprazole (PRILOSEC) 20 MG capsule TAKE 1 CAPSULE BY MOUTH EVERY DAY Please call [phone] to schedule an appointment for more refills 90 capsule 0   phenazopyridine (PYRIDIUM) 200 MG tablet Take 1 tablet (200 mg total) by mouth 3 (three) times daily as needed for pain. 10 tablet 0   nitrofurantoin, macrocrystal-monohydrate, (MACROBID) 100 MG capsule Take 1 capsule (100 mg total) by mouth 2 (two) times daily. (Patient not taking: Reported on 08/06/2021) 14 capsule 0   No facility-administered medications prior to visit.    Allergies  Allergen Reactions   Other     Pollen, cat   Cat Hair Extract Itching   Gramineae Pollens Itching    Review of Systems  Constitutional:  Negative for fever and malaise/fatigue.  HENT:   Negative for congestion.   Eyes:  Negative for blurred vision.  Respiratory:  Negative for shortness of breath.   Cardiovascular:  Negative for chest pain, palpitations and leg swelling.  Gastrointestinal:  Negative for abdominal pain, blood in stool and nausea.  Genitourinary:  Negative for dysuria and frequency.  Musculoskeletal:  Positive for neck pain. Negative for falls.  Skin:  Negative for rash.  Neurological:  Negative for dizziness, tingling (in both fingers), loss of consciousness and headaches.       (-)numbness in fingers  Endo/Heme/Allergies:  Negative for environmental allergies.  Psychiatric/Behavioral:  Negative for depression. The patient is not nervous/anxious.       Objective:    Physical Exam Vitals and nursing note reviewed.  Constitutional:      General: She is not in acute distress.    Appearance: Normal appearance. She is well-developed. She is not ill-appearing.  HENT:     Head: Normocephalic and atraumatic.     Right Ear: External ear normal.     Left Ear: External ear normal.  Eyes:     Extraocular Movements: Extraocular movements intact.     Conjunctiva/sclera: Conjunctivae normal.     Pupils: Pupils are equal, round, and reactive to light.  Neck:     Thyroid: No thyromegaly.     Vascular: No carotid bruit or JVD.  Cardiovascular:     Rate and Rhythm: Normal rate and regular rhythm.     Heart sounds: Normal heart sounds. No murmur heard.   No gallop.  Pulmonary:     Effort: Pulmonary effort is normal. No respiratory distress.     Breath sounds: Normal breath sounds. No wheezing or rales.  Chest:     Chest wall: No tenderness.  Musculoskeletal:        General: Tenderness present.     Cervical back: Normal range of motion and neck supple.     Comments: Tenderness on trapezius with palpation b/l   Skin:    General: Skin is warm and dry.  Neurological:     Mental Status: She is alert and oriented to person, place, and time.  Psychiatric:         Behavior: Behavior normal.        Judgment: Judgment normal.    BP 100/84 (BP Location: Left Arm, Patient Position: Sitting, Cuff Size: Normal)   Pulse 71   Temp 97.8 F (36.6 C) (Oral)   Resp 18   Ht 5\' 1"  (1.549 m)   Wt 205 lb 12.8 oz (93.4  kg)   SpO2 97%   BMI 38.89 kg/m  Wt Readings from Last 3 Encounters:  08/06/21 205 lb 12.8 oz (93.4 kg)  03/16/21 218 lb 3.2 oz (99 kg)  01/28/21 213 lb (96.6 kg)    Diabetic Foot Exam - Simple   No data filed    Lab Results  Component Value Date   WBC 5.7 02/22/2020   HGB 14.0 02/22/2020   HCT 41.5 02/22/2020   PLT 279.0 02/22/2020   GLUCOSE 91 06/24/2020   CHOL 185 06/24/2020   TRIG 243 (H) 06/24/2020   HDL 50 06/24/2020   LDLCALC 98 06/24/2020   ALT 14 06/24/2020   AST 17 06/24/2020   NA 138 06/24/2020   K 4.2 06/24/2020   CL 103 06/24/2020   CREATININE 0.94 06/24/2020   BUN 8 06/24/2020   CO2 27 06/24/2020   TSH 1.21 02/22/2020   HGBA1C 5.7 (H) 06/24/2020    Lab Results  Component Value Date   TSH 1.21 02/22/2020   Lab Results  Component Value Date   WBC 5.7 02/22/2020   HGB 14.0 02/22/2020   HCT 41.5 02/22/2020   MCV 91.3 02/22/2020   PLT 279.0 02/22/2020   Lab Results  Component Value Date   NA 138 06/24/2020   K 4.2 06/24/2020   CO2 27 06/24/2020   GLUCOSE 91 06/24/2020   BUN 8 06/24/2020   CREATININE 0.94 06/24/2020   BILITOT 0.5 06/24/2020   ALKPHOS 66 02/22/2020   AST 17 06/24/2020   ALT 14 06/24/2020   PROT 6.9 06/24/2020   ALBUMIN 4.1 02/22/2020   CALCIUM 9.7 06/24/2020   GFR 82.37 02/22/2020   Lab Results  Component Value Date   CHOL 185 06/24/2020   Lab Results  Component Value Date   HDL 50 06/24/2020   Lab Results  Component Value Date   LDLCALC 98 06/24/2020   Lab Results  Component Value Date   TRIG 243 (H) 06/24/2020   Lab Results  Component Value Date   CHOLHDL 3.7 06/24/2020   Lab Results  Component Value Date   HGBA1C 5.7 (H) 06/24/2020       Assessment  & Plan:   Problem List Items Addressed This Visit       Unprioritized   Cervical strain - Primary    Muscle relaxer  Ice  Massage  rto prn       Other Visit Diagnoses     Other fatigue       Relevant Medications   cyclobenzaprine (FLEXERIL) 10 MG tablet        Meds ordered this encounter  Medications   cyclobenzaprine (FLEXERIL) 10 MG tablet    Sig: Take 1 tablet (10 mg total) by mouth 3 (three) times daily as needed for muscle spasms.    Dispense:  30 tablet    Refill:  0    I, Dr. Roma Schanz, DO, personally preformed the services described in this documentation.  All medical record entries made by the scribe were at my direction and in my presence.  I have reviewed the chart and discharge instructions (if applicable) and agree that the record reflects my personal performance and is accurate and complete. 08/06/2021   I,[name],acting as a scribe for [name], DO.,have documented all relevant documentation on the behalf of [name], DO,as directed by  [name], DO while in the presence of [name], DO.   [name], DO

## 2021-11-22 ENCOUNTER — Other Ambulatory Visit: Payer: Self-pay | Admitting: Family Medicine

## 2021-12-11 DIAGNOSIS — E669 Obesity, unspecified: Secondary | ICD-10-CM | POA: Diagnosis not present

## 2021-12-11 DIAGNOSIS — M25511 Pain in right shoulder: Secondary | ICD-10-CM | POA: Diagnosis not present

## 2021-12-11 DIAGNOSIS — I10 Essential (primary) hypertension: Secondary | ICD-10-CM | POA: Diagnosis not present

## 2022-01-09 DIAGNOSIS — Y999 Unspecified external cause status: Secondary | ICD-10-CM | POA: Diagnosis not present

## 2022-01-09 DIAGNOSIS — S3991XA Unspecified injury of abdomen, initial encounter: Secondary | ICD-10-CM | POA: Diagnosis not present

## 2022-01-09 DIAGNOSIS — R55 Syncope and collapse: Secondary | ICD-10-CM | POA: Diagnosis not present

## 2022-01-09 DIAGNOSIS — Y92410 Unspecified street and highway as the place of occurrence of the external cause: Secondary | ICD-10-CM | POA: Diagnosis not present

## 2022-01-09 DIAGNOSIS — M25551 Pain in right hip: Secondary | ICD-10-CM | POA: Diagnosis not present

## 2022-01-09 DIAGNOSIS — M2578 Osteophyte, vertebrae: Secondary | ICD-10-CM | POA: Diagnosis not present

## 2022-01-09 DIAGNOSIS — Z041 Encounter for examination and observation following transport accident: Secondary | ICD-10-CM | POA: Diagnosis not present

## 2022-01-09 DIAGNOSIS — R079 Chest pain, unspecified: Secondary | ICD-10-CM | POA: Diagnosis not present

## 2022-01-09 DIAGNOSIS — M542 Cervicalgia: Secondary | ICD-10-CM | POA: Diagnosis not present

## 2022-01-09 DIAGNOSIS — E041 Nontoxic single thyroid nodule: Secondary | ICD-10-CM | POA: Diagnosis not present

## 2022-01-09 DIAGNOSIS — R4020 Unspecified coma: Secondary | ICD-10-CM | POA: Diagnosis not present

## 2022-01-09 DIAGNOSIS — S299XXA Unspecified injury of thorax, initial encounter: Secondary | ICD-10-CM | POA: Diagnosis not present

## 2022-01-09 DIAGNOSIS — R0789 Other chest pain: Secondary | ICD-10-CM | POA: Diagnosis not present

## 2022-01-11 DIAGNOSIS — R55 Syncope and collapse: Secondary | ICD-10-CM | POA: Diagnosis not present

## 2022-01-12 ENCOUNTER — Ambulatory Visit (INDEPENDENT_AMBULATORY_CARE_PROVIDER_SITE_OTHER): Payer: BC Managed Care – PPO | Admitting: Family Medicine

## 2022-01-12 ENCOUNTER — Encounter: Payer: Self-pay | Admitting: Family Medicine

## 2022-01-12 ENCOUNTER — Telehealth (HOSPITAL_BASED_OUTPATIENT_CLINIC_OR_DEPARTMENT_OTHER): Payer: Self-pay

## 2022-01-12 DIAGNOSIS — E041 Nontoxic single thyroid nodule: Secondary | ICD-10-CM

## 2022-01-12 DIAGNOSIS — M25511 Pain in right shoulder: Secondary | ICD-10-CM

## 2022-01-12 DIAGNOSIS — G8929 Other chronic pain: Secondary | ICD-10-CM | POA: Diagnosis not present

## 2022-01-12 NOTE — Assessment & Plan Note (Addendum)
Slowly improving ?rto prn ?ER visit and imaging/ labs reviewed  ?
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
## 2022-01-12 NOTE — Assessment & Plan Note (Signed)
Check US thyroid and labs  ?
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
## 2022-01-13 LAB — THYROID PANEL WITH TSH
Free Thyroxine Index: 2.2 (ref 1.4–3.8)
T3 Uptake: 27 % (ref 22–35)
T4, Total: 8 ug/dL (ref 5.1–11.9)
TSH: 0.87 mIU/L

## 2022-01-15 ENCOUNTER — Encounter: Payer: Self-pay | Admitting: Family Medicine

## 2022-01-15 ENCOUNTER — Ambulatory Visit (INDEPENDENT_AMBULATORY_CARE_PROVIDER_SITE_OTHER): Payer: BC Managed Care – PPO | Admitting: Family Medicine

## 2022-01-15 ENCOUNTER — Ambulatory Visit (HOSPITAL_BASED_OUTPATIENT_CLINIC_OR_DEPARTMENT_OTHER)
Admission: RE | Admit: 2022-01-15 | Discharge: 2022-01-15 | Disposition: A | Discharge status: Home / Self Care | Payer: BC Managed Care – PPO | Source: Ambulatory Visit | Attending: Family Medicine | Admitting: Family Medicine

## 2022-01-15 VITALS — BP 126/84 | HR 88 | Temp 97.9°F | Resp 16 | Ht 60.0 in | Wt 198.2 lb

## 2022-01-15 DIAGNOSIS — R079 Chest pain, unspecified: Secondary | ICD-10-CM | POA: Insufficient documentation

## 2022-01-15 DIAGNOSIS — R0602 Shortness of breath: Secondary | ICD-10-CM | POA: Diagnosis not present

## 2022-01-15 MED ORDER — IBUPROFEN 800 MG PO TABS: 800.0000 mg | ORAL_TABLET | Freq: Three times a day (TID) | ORAL | 0 refills | Status: AC | PRN
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
## 2022-01-29 ENCOUNTER — Ambulatory Visit (HOSPITAL_BASED_OUTPATIENT_CLINIC_OR_DEPARTMENT_OTHER)
Admission: RE | Admit: 2022-01-29 | Discharge: 2022-01-29 | Disposition: A | Discharge status: Home / Self Care | Payer: BC Managed Care – PPO | Source: Ambulatory Visit | Attending: Family Medicine | Admitting: Family Medicine

## 2022-01-29 DIAGNOSIS — E041 Nontoxic single thyroid nodule: Secondary | ICD-10-CM | POA: Insufficient documentation

## 2022-02-01 ENCOUNTER — Other Ambulatory Visit: Payer: Self-pay | Admitting: Family Medicine

## 2022-02-01 DIAGNOSIS — E041 Nontoxic single thyroid nodule: Secondary | ICD-10-CM

## 2022-02-19 ENCOUNTER — Ambulatory Visit (INDEPENDENT_AMBULATORY_CARE_PROVIDER_SITE_OTHER): Payer: BC Managed Care – PPO | Admitting: Family Medicine

## 2022-02-19 ENCOUNTER — Encounter: Payer: Self-pay | Admitting: Family Medicine

## 2022-02-19 VITALS — BP 110/80 | HR 88 | Temp 98.6°F | Resp 18 | Ht 61.0 in | Wt 197.8 lb

## 2022-02-19 DIAGNOSIS — R319 Hematuria, unspecified: Secondary | ICD-10-CM

## 2022-02-19 DIAGNOSIS — N390 Urinary tract infection, site not specified: Secondary | ICD-10-CM | POA: Diagnosis not present

## 2022-02-19 DIAGNOSIS — R300 Dysuria: Secondary | ICD-10-CM | POA: Diagnosis not present

## 2022-02-19 LAB — POC URINALSYSI DIPSTICK (AUTOMATED)
Glucose, UA: NEGATIVE
Nitrite, UA: POSITIVE
Protein, UA: NEGATIVE
Spec Grav, UA: 1.02 (ref 1.010–1.025)
Urobilinogen, UA: 0.2 E.U./dL
pH, UA: 6.5 (ref 5.0–8.0)

## 2022-02-19 MED ORDER — PHENAZOPYRIDINE HCL 200 MG PO TABS: 200.0000 mg | ORAL_TABLET | Freq: Three times a day (TID) | ORAL | 0 refills | Status: DC | PRN

## 2022-02-19 MED ORDER — CIPROFLOXACIN HCL 500 MG PO TABS: 500.0000 mg | ORAL_TABLET | Freq: Two times a day (BID) | ORAL | 0 refills | Status: DC

## 2022-02-19 NOTE — Progress Notes (Signed)
Subjective:   By signing my name below, I, [name], attest that this documentation has been prepared under the direction and in the presence of [name], DO. 02/19/2022    Patient ID: Sheena Olsen, female    DOB: 1975/11/08, 46 y.o.   MRN: [id number]  Chief Complaint  Patient presents with   Dysuria    Sxs started Wednesday, pt states having frequancy, burning, visual blood in urine    HPI Patient is in today for an office visit  She complains of abdominal pressure, dysuria, urinary frequency and hematuria that started since Wednesday. She took 800 mg ibuprofen to manage the pain and pyridium to manage the symptoms and it provided great relief. Denies abdominal and back pain.   Urinalysis is positive for UTI.  Past Medical History:  Diagnosis Date   Allergy    Migraines    Since age 41   Uterine fibroid     Past Surgical History:  Procedure Laterality Date   ABDOMINAL HYSTERECTOMY  07/30/2019   CESAREAN SECTION     CHOLECYSTECTOMY     SHOULDER SURGERY Right    Right   WISDOM TOOTH EXTRACTION  01/25/2020    Family History  Problem Relation Age of Onset   Diabetes Other    Hypertension Other    Alzheimer's disease Other    Thyroid disease Mother    Migraines Neg Hx    Colon cancer Neg Hx    Esophageal cancer Neg Hx    Stomach cancer Neg Hx    Rectal cancer Neg Hx     Social History   Socioeconomic History   Marital status: Married    Spouse name: Not on file   Number of children: 1   Years of education: Masters   Highest education level: Not on file  Occupational History   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  Tobacco Use   Smoking status: Never   Smokeless tobacco: Never  Vaping Use   Vaping Use: Never used  Substance and Sexual Activity   Alcohol use: Yes    Alcohol/week: 0.0 standard drinks    Comment: Rare   Drug use: No   Sexual activity: Not on file  Other Topics Concern   Not on file  Social History Narrative   Lives with son     Caffeine use: 1 cup coffee per day or tea   Social Determinants of Health   Financial Resource Strain: Not on file  Food Insecurity: Not on file  Transportation Needs: Not on file  Physical Activity: Not on file  Stress: Not on file  Social Connections: Not on file  Intimate Partner Violence: Not on file    Outpatient Medications Prior to Visit  Medication Sig Dispense Refill   cetirizine (ZYRTEC) 10 MG tablet Take 1 tablet (10 mg total) by mouth daily. 30 tablet 0   chlorpheniramine (CHLOR-TRIMETON) 4 MG tablet Take 4 mg by mouth 2 (two) times daily as needed for allergies.     cyclobenzaprine (FLEXERIL) 10 MG tablet Take 1 tablet (10 mg total) by mouth 3 (three) times daily as needed for muscle spasms. 30 tablet 0   ibuprofen (ADVIL) 800 MG tablet Take 1 tablet (800 mg total) by mouth every 8 (eight) hours as needed. 30 tablet 0   omeprazole (PRILOSEC) 20 MG capsule TAKE 1 CAPSULE BY MOUTH EVERY DAY Please call [phone] to schedule an appointment for more refills 90 capsule 0   No facility-administered medications prior to visit.  Allergies  Allergen Reactions   Other     Pollen, cat   Cat Hair Extract Itching   Gramineae Pollens Itching    Review of Systems  Constitutional:  Negative for fever.  HENT:  Negative for congestion, ear pain, hearing loss, sinus pain and sore throat.   Eyes:  Negative for blurred vision and pain.  Respiratory:  Negative for cough, sputum production, shortness of breath and wheezing.   Cardiovascular:  Negative for chest pain and palpitations.  Gastrointestinal:  Negative for blood in stool, constipation, diarrhea, nausea and vomiting.  Genitourinary:  Positive for dysuria, frequency and hematuria. Negative for urgency.       (+) abdominal pressure   Musculoskeletal:  Negative for back pain, falls and myalgias.  Neurological:  Negative for dizziness, sensory change, loss of consciousness, weakness and headaches.  Endo/Heme/Allergies:   Negative for environmental allergies. Does not bruise/bleed easily.  Psychiatric/Behavioral:  Negative for depression and suicidal ideas. The patient is not nervous/anxious and does not have insomnia.       Objective:    Physical Exam Constitutional:      General: She is not in acute distress.    Appearance: Normal appearance. She is not ill-appearing.  HENT:     Head: Normocephalic and atraumatic.     Right Ear: External ear normal.     Left Ear: External ear normal.  Eyes:     Extraocular Movements: Extraocular movements intact.     Pupils: Pupils are equal, round, and reactive to light.  Cardiovascular:     Rate and Rhythm: Normal rate and regular rhythm.     Pulses: Normal pulses.     Heart sounds: Normal heart sounds. No murmur heard.   No gallop.  Pulmonary:     Effort: Pulmonary effort is normal. No respiratory distress.     Breath sounds: Normal breath sounds. No wheezing, rhonchi or rales.  Abdominal:     General: Bowel sounds are normal. There is no distension.     Palpations: Abdomen is soft. There is no mass.     Tenderness: There is no abdominal tenderness. There is no guarding or rebound.     Hernia: No hernia is present.  Musculoskeletal:     Cervical back: Normal range of motion and neck supple.  Lymphadenopathy:     Cervical: No cervical adenopathy.  Skin:    General: Skin is warm and dry.  Neurological:     Mental Status: She is alert and oriented to person, place, and time.  Psychiatric:        Behavior: Behavior normal.    BP 110/80 (BP Location: Left Arm, Patient Position: Sitting, Cuff Size: Normal)   Pulse 88   Temp 98.6 F (37 C) (Oral)   Resp 18   Ht '5\' 1"'$  (1.549 m)   Wt 197 lb 12.8 oz (89.7 kg)   LMP 02/10/2017 (Exact Date)   SpO2 97%   BMI 37.37 kg/m  Wt Readings from Last 3 Encounters:  02/19/22 197 lb 12.8 oz (89.7 kg)  01/15/22 198 lb 3.2 oz (89.9 kg)  01/12/22 200 lb (90.7 kg)    Diabetic Foot Exam - Simple   No data filed     Lab Results  Component Value Date   WBC 5.7 02/22/2020   HGB 14.0 02/22/2020   HCT 41.5 02/22/2020   PLT 279.0 02/22/2020   GLUCOSE 91 06/24/2020   CHOL 185 06/24/2020   TRIG 243 (H) 06/24/2020   HDL 50 06/24/2020  LDLCALC 98 06/24/2020   ALT 14 06/24/2020   AST 17 06/24/2020   NA 138 06/24/2020   K 4.2 06/24/2020   CL 103 06/24/2020   CREATININE 0.94 06/24/2020   BUN 8 06/24/2020   CO2 27 06/24/2020   TSH 0.87 01/12/2022   HGBA1C 5.7 (H) 06/24/2020    Lab Results  Component Value Date   TSH 0.87 01/12/2022   Lab Results  Component Value Date   WBC 5.7 02/22/2020   HGB 14.0 02/22/2020   HCT 41.5 02/22/2020   MCV 91.3 02/22/2020   PLT 279.0 02/22/2020   Lab Results  Component Value Date   NA 138 06/24/2020   K 4.2 06/24/2020   CO2 27 06/24/2020   GLUCOSE 91 06/24/2020   BUN 8 06/24/2020   CREATININE 0.94 06/24/2020   BILITOT 0.5 06/24/2020   ALKPHOS 66 02/22/2020   AST 17 06/24/2020   ALT 14 06/24/2020   PROT 6.9 06/24/2020   ALBUMIN 4.1 02/22/2020   CALCIUM 9.7 06/24/2020   GFR 82.37 02/22/2020   Lab Results  Component Value Date   CHOL 185 06/24/2020   Lab Results  Component Value Date   HDL 50 06/24/2020   Lab Results  Component Value Date   LDLCALC 98 06/24/2020   Lab Results  Component Value Date   TRIG 243 (H) 06/24/2020   Lab Results  Component Value Date   CHOLHDL 3.7 06/24/2020   Lab Results  Component Value Date   HGBA1C 5.7 (H) 06/24/2020       Assessment & Plan:   Problem List Items Addressed This Visit   None    No orders of the defined types were placed in this encounter.   I,[name],acting as a Education administrator for [employer], DO.,have documented all relevant documentation on the behalf of [name], DO,as directed by  [name], DO while in the presence of [place], DO. , personally preformed the services described in this documentation.   All medical record entries made by the scribe were at my direction and in my presence.  I have reviewed the chart and discharge instructions (if applicable) and agree that the record reflects my personal performance and is accurate and complete. 02/19/2022

## 2022-02-19 NOTE — Assessment & Plan Note (Signed)
cipro x 5 days  Pyridium 2 days  Culture pending  rto prn

## 2022-02-19 NOTE — Patient Instructions (Signed)
Urinary Tract Infection, Adult  A urinary tract infection (UTI) is an infection of any part of the urinary tract. The urinary tract includes the kidneys, ureters, bladder, and urethra. These organs make, store, and get rid of urine in the body. An upper UTI affects the ureters and kidneys. A lower UTI affects the bladder and urethra. What are the causes? Most urinary tract infections are caused by bacteria in your genital area around your urethra, where urine leaves your body. These bacteria grow and cause inflammation of your urinary tract. What increases the risk? You are more likely to develop this condition if: You have a urinary catheter that stays in place. You are not able to control when you urinate or have a bowel movement (incontinence). You are female and you: Use a spermicide or diaphragm for birth control. Have low estrogen levels. Are pregnant. You have certain genes that increase your risk. You are sexually active. You take antibiotic medicines. You have a condition that causes your flow of urine to slow down, such as: An enlarged prostate, if you are female. Blockage in your urethra. A kidney stone. A nerve condition that affects your bladder control (neurogenic bladder). Not getting enough to drink, or not urinating often. You have certain medical conditions, such as: Diabetes. A weak disease-fighting system (immunesystem). Sickle cell disease. Gout. Spinal cord injury. What are the signs or symptoms? Symptoms of this condition include: Needing to urinate right away (urgency). Frequent urination. This may include small amounts of urine each time you urinate. Pain or burning with urination. Blood in the urine. Urine that smells bad or unusual. Trouble urinating. Cloudy urine. Vaginal discharge, if you are female. Pain in the abdomen or the lower back. You may also have: Vomiting or a decreased appetite. Confusion. Irritability or tiredness. A fever or  chills. Diarrhea. The first symptom in older adults may be confusion. In some cases, they may not have any symptoms until the infection has worsened. How is this diagnosed? This condition is diagnosed based on your medical history and a physical exam. You may also have other tests, including: Urine tests. Blood tests. Tests for STIs (sexually transmitted infections). If you have had more than one UTI, a cystoscopy or imaging studies may be done to determine the cause of the infections. How is this treated? Treatment for this condition includes: Antibiotic medicine. Over-the-counter medicines to treat discomfort. Drinking enough water to stay hydrated. If you have frequent infections or have other conditions such as a kidney stone, you may need to see a health care provider who specializes in the urinary tract (urologist). In rare cases, urinary tract infections can cause sepsis. Sepsis is a life-threatening condition that occurs when the body responds to an infection. Sepsis is treated in the hospital with IV antibiotics, fluids, and other medicines. Follow these instructions at home:  Medicines Take over-the-counter and prescription medicines only as told by your health care provider. If you were prescribed an antibiotic medicine, take it as told by your health care provider. Do not stop using the antibiotic even if you start to feel better. General instructions Make sure you: Empty your bladder often and completely. Do not hold urine for long periods of time. Empty your bladder after sex. Wipe from front to back after urinating or having a bowel movement if you are female. Use each tissue only one time when you wipe. Drink enough fluid to keep your urine pale yellow. Keep all follow-up visits. This is important. Contact a health   care provider if: Your symptoms do not get better after 1-2 days. Your symptoms go away and then return. Get help right away if: You have severe pain in  your back or your lower abdomen. You have a fever or chills. You have nausea or vomiting. Summary A urinary tract infection (UTI) is an infection of any part of the urinary tract, which includes the kidneys, ureters, bladder, and urethra. Most urinary tract infections are caused by bacteria in your genital area. Treatment for this condition often includes antibiotic medicines. If you were prescribed an antibiotic medicine, take it as told by your health care provider. Do not stop using the antibiotic even if you start to feel better. Keep all follow-up visits. This is important. This information is not intended to replace advice given to you by your health care provider. Make sure you discuss any questions you have with your health care provider. Document Revised: 04/25/2020 Document Reviewed: 04/25/2020 Elsevier Patient Education  2023 Elsevier Inc.  

## 2022-02-20 LAB — URINE CULTURE
MICRO NUMBER:: 13450675
SPECIMEN QUALITY:: ADEQUATE

## 2022-02-24 ENCOUNTER — Other Ambulatory Visit: Payer: Self-pay | Admitting: Family Medicine

## 2022-02-24 DIAGNOSIS — N390 Urinary tract infection, site not specified: Secondary | ICD-10-CM

## 2022-02-24 NOTE — Telephone Encounter (Signed)
Rx sent 

## 2022-02-24 NOTE — Telephone Encounter (Signed)
Pt called stating that she only received a five day supply when the direction said to take the medication for ten days. Pt was wondering if she could get the other five day supply sent to the pharmacy.

## 2022-03-02 DIAGNOSIS — Z01419 Encounter for gynecological examination (general) (routine) without abnormal findings: Secondary | ICD-10-CM | POA: Diagnosis not present

## 2022-03-02 DIAGNOSIS — N898 Other specified noninflammatory disorders of vagina: Secondary | ICD-10-CM | POA: Diagnosis not present

## 2022-04-13 ENCOUNTER — Ambulatory Visit (INDEPENDENT_AMBULATORY_CARE_PROVIDER_SITE_OTHER): Payer: BC Managed Care – PPO | Admitting: Family Medicine

## 2022-04-13 ENCOUNTER — Encounter: Payer: Self-pay | Admitting: Family Medicine

## 2022-04-13 ENCOUNTER — Other Ambulatory Visit (HOSPITAL_COMMUNITY)
Admission: RE | Admit: 2022-04-13 | Discharge: 2022-04-13 | Disposition: A | Discharge status: Home / Self Care | Payer: BC Managed Care – PPO | Source: Ambulatory Visit | Attending: Family Medicine | Admitting: Family Medicine

## 2022-04-13 VITALS — BP 113/72 | HR 76 | Ht 61.0 in | Wt 199.2 lb

## 2022-04-13 DIAGNOSIS — N898 Other specified noninflammatory disorders of vagina: Secondary | ICD-10-CM

## 2022-04-13 MED ORDER — FLUCONAZOLE 150 MG PO TABS: 150.0000 mg | ORAL_TABLET | Freq: Once | ORAL | 0 refills | Status: AC

## 2022-04-13 NOTE — Patient Instructions (Signed)
Sending off the swab to check for yeast and BV (you declined STD testing today).  Sounds like possibly yeast, so I will give you some Diflucan to have on hand, but we will let you know if we need to change anything based on the swab.

## 2022-04-13 NOTE — Progress Notes (Signed)
   Acute Office Visit  Subjective:     Patient ID: Sheena Olsen, female    DOB: 01-12-1976, 46 y.o.   MRN: [id number]  CC: possible yeast infection    Vaginal Itching The patient's primary symptoms include genital itching. The patient's pertinent negatives include no genital lesions, genital odor, genital rash, pelvic pain, vaginal bleeding or vaginal discharge. This is a new problem. The current episode started in the past 7 days (2-3 days ago). The problem occurs constantly. The problem has been unchanged. The patient is experiencing no pain. She is not pregnant. Pertinent negatives include no abdominal pain, anorexia, back pain, chills, constipation, diarrhea, discolored urine, dysuria, fever, flank pain, frequency, headaches, hematuria, joint pain, joint swelling, nausea, painful intercourse, rash, sore throat, urgency or vomiting. Nothing aggravates the symptoms. Treatments tried: vaginal wipes with aloe. The treatment provided no relief. She is sexually active. No, her partner does not have an STD.      ROS:  A comprehensive ROS was completed and negative except as noted per HPI       Objective:    BP 113/72   Pulse 76   Ht '5\' 1"'$  (1.549 m)   Wt 199 lb 3.2 oz (90.4 kg)   LMP 02/10/2017 (Exact Date)   BMI 37.64 kg/m    Physical Exam Vitals reviewed.  Constitutional:      Appearance: Normal appearance.  Genitourinary:    Comments: Deferred, self-swab Neurological:     General: No focal deficit present.     Mental Status: She is alert and oriented to person, place, and time. Mental status is at baseline.  Psychiatric:        Mood and Affect: Mood normal.        Behavior: Behavior normal.        Thought Content: Thought content normal.        Judgment: Judgment normal.     No results found for any visits on 04/13/22.      Assessment & Plan:   Problem List Items Addressed This Visit   None Visit Diagnoses     Vaginal itching    -  Primary Sending off the  swab to check for yeast and BV (you declined STD testing today).  Sounds like possibly yeast, so I will give you some Diflucan to have on hand, but we will let you know if we need to change anything based on the swab.   Patient aware of signs/symptoms requiring further/urgent evaluation.    Relevant Medications   fluconazole (DIFLUCAN) 150 MG tablet   Other Relevant Orders   Cervicovaginal ancillary only       Meds ordered this encounter  Medications   fluconazole (DIFLUCAN) 150 MG tablet    Sig: Take 1 tablet (150 mg total) by mouth once for 1 dose. Repeat in 72 hours if not improved.    Dispense:  2 tablet    Refill:  0    Order Specific Question:   Supervising Provider    Answer:   Penni Homans A [[id number]]    Return if symptoms worsen or fail to improve.  Terrilyn Saver, NP

## 2022-04-14 LAB — CERVICOVAGINAL ANCILLARY ONLY
Bacterial Vaginitis (gardnerella): NEGATIVE
Candida Glabrata: NEGATIVE
Candida Vaginitis: POSITIVE — AB
Comment: NEGATIVE
Comment: NEGATIVE
Comment: NEGATIVE

## 2022-07-15 ENCOUNTER — Ambulatory Visit (INDEPENDENT_AMBULATORY_CARE_PROVIDER_SITE_OTHER): Payer: BC Managed Care – PPO | Admitting: Nurse Practitioner

## 2022-07-15 ENCOUNTER — Encounter: Payer: Self-pay | Admitting: Nurse Practitioner

## 2022-07-15 VITALS — BP 120/90 | HR 81 | Temp 98.0°F | Wt 206.0 lb

## 2022-07-15 DIAGNOSIS — N3001 Acute cystitis with hematuria: Secondary | ICD-10-CM

## 2022-07-15 DIAGNOSIS — R8290 Unspecified abnormal findings in urine: Secondary | ICD-10-CM | POA: Diagnosis not present

## 2022-07-15 DIAGNOSIS — R300 Dysuria: Secondary | ICD-10-CM | POA: Diagnosis not present

## 2022-07-15 LAB — POCT URINALYSIS DIPSTICK
Bilirubin, UA: NEGATIVE
Blood, UA: POSITIVE
Glucose, UA: NEGATIVE
Ketones, UA: NEGATIVE
Nitrite, UA: POSITIVE
Protein, UA: POSITIVE — AB
Spec Grav, UA: 1.02 (ref 1.010–1.025)
Urobilinogen, UA: 1 E.U./dL
pH, UA: 6 (ref 5.0–8.0)

## 2022-07-15 MED ORDER — NITROFURANTOIN MONOHYD MACRO 100 MG PO CAPS: 100.0000 mg | ORAL_CAPSULE | Freq: Two times a day (BID) | ORAL | 0 refills | Status: DC

## 2022-07-15 MED ORDER — PHENAZOPYRIDINE HCL 200 MG PO TABS: 200.0000 mg | ORAL_TABLET | Freq: Three times a day (TID) | ORAL | 0 refills | Status: DC | PRN

## 2022-07-15 NOTE — Progress Notes (Signed)
Acute Office Visit  Subjective:     Patient ID: Sheena Olsen, female    DOB: 04/27/1976, 46 y.o.   MRN: [id number]  Chief Complaint  Patient presents with   Recurrent UTI    Pt c/o bladder pressure/ dysuria/ urinary frequency/ mild burning sensation when urination x1 day.     HPI Patient is in today for urinary frequency, pressure, and burning for 1 day.   URINARY SYMPTOMS  Dysuria: burning Urinary frequency: yes Urgency: yes Small volume voids: yes Symptom severity: moderate Urinary incontinence: no Foul odor: no Hematuria: yes Abdominal pain: no Back pain: yes Suprapubic pain/pressure: yes Flank pain: no Fever:  no Vomiting: no Relief with cranberry juice:  n/a Relief with pyridium: yes Status: worse Previous urinary tract infection: yes Recurrent urinary tract infection: no Treatments attempted: pyridium and increasing fluids   ROS See pertinent positives and negatives per HPI.     Objective:    BP (!) 120/90 (BP Location: Right Arm, Patient Position: Sitting, Cuff Size: Large)   Pulse 81   Temp 98 F (36.7 C) (Temporal)   Wt 206 lb (93.4 kg)   LMP 02/10/2017 (Exact Date)   SpO2 96%   BMI 38.92 kg/m    Physical Exam Vitals and nursing note reviewed.  Constitutional:      General: She is not in acute distress.    Appearance: Normal appearance.  HENT:     Head: Normocephalic.  Eyes:     Conjunctiva/sclera: Conjunctivae normal.  Cardiovascular:     Rate and Rhythm: Normal rate and regular rhythm.     Pulses: Normal pulses.     Heart sounds: Normal heart sounds.  Pulmonary:     Effort: Pulmonary effort is normal.     Breath sounds: Normal breath sounds.  Abdominal:     Palpations: Abdomen is soft.     Tenderness: There is no abdominal tenderness. There is no right CVA tenderness or left CVA tenderness.  Musculoskeletal:     Cervical back: Normal range of motion.  Skin:    General: Skin is warm.  Neurological:     General: No focal  deficit present.     Mental Status: She is alert and oriented to person, place, and time.  Psychiatric:        Mood and Affect: Mood normal.        Behavior: Behavior normal.        Thought Content: Thought content normal.        Judgment: Judgment normal.     Results for orders placed or performed in visit on 07/15/22  POCT Urinalysis Dipstick  Result Value Ref Range   Color, UA Orange    Clarity, UA cloudy    Glucose, UA Negative Negative   Bilirubin, UA neg    Ketones, UA neg    Spec Grav, UA 1.020 1.010 - 1.025   Blood, UA positive    pH, UA 6.0 5.0 - 8.0   Protein, UA Positive (A) Negative   Urobilinogen, UA 1.0 0.2 or 1.0 E.U./dL   Nitrite, UA pos    Leukocytes, UA Moderate (2+) (A) Negative   Appearance     Odor          Assessment & Plan:   Problem List Items Addressed This Visit       Genitourinary   Urinary tract infection with hematuria - Primary   Relevant Medications   nitrofurantoin, macrocrystal-monohydrate, (MACROBID) 100 MG capsule   phenazopyridine (PYRIDIUM) 200 MG  tablet   Other Relevant Orders   Urine Culture   Other Visit Diagnoses     Dysuria       U/A positive for blood and leukocytes. Will treat for UTI.    Relevant Orders   POCT Urinalysis Dipstick (Completed)   Urine Culture       Meds ordered this encounter  Medications   nitrofurantoin, macrocrystal-monohydrate, (MACROBID) 100 MG capsule    Sig: Take 1 capsule (100 mg total) by mouth 2 (two) times daily.    Dispense:  10 capsule    Refill:  0   phenazopyridine (PYRIDIUM) 200 MG tablet    Sig: Take 1 tablet (200 mg total) by mouth 3 (three) times daily as needed for pain.    Dispense:  9 tablet    Refill:  0    Return if symptoms worsen or fail to improve.  Charyl Dancer, NP

## 2022-07-15 NOTE — Patient Instructions (Signed)
It was great to see you!  Start macrobid twice a day with food for your UTI. Drink plenty of fluids. You can take the pyridium 3 times a day as needed for urinary burning.   Let's follow-up if your symptoms worsen or don't improve.   Take care,  Vance Peper, NP

## 2022-07-18 LAB — URINE CULTURE
MICRO NUMBER:: 14073999
SPECIMEN QUALITY:: ADEQUATE

## 2022-09-09 ENCOUNTER — Encounter: Payer: Self-pay | Admitting: Family

## 2022-09-09 ENCOUNTER — Telehealth: Payer: Self-pay | Admitting: Family Medicine

## 2022-09-09 ENCOUNTER — Other Ambulatory Visit: Payer: Self-pay | Admitting: Family

## 2022-09-09 ENCOUNTER — Ambulatory Visit (INDEPENDENT_AMBULATORY_CARE_PROVIDER_SITE_OTHER): Payer: BC Managed Care – PPO | Admitting: Family

## 2022-09-09 VITALS — BP 110/76 | HR 87 | Resp 18 | Ht 61.0 in | Wt 209.4 lb

## 2022-09-09 DIAGNOSIS — R1032 Left lower quadrant pain: Secondary | ICD-10-CM | POA: Diagnosis not present

## 2022-09-09 MED ORDER — MELOXICAM 15 MG PO TABS: 15.0000 mg | ORAL_TABLET | Freq: Every day | ORAL | 0 refills | Status: DC

## 2022-09-09 NOTE — Telephone Encounter (Signed)
Patient called to say that work note should have been for yesterday and today. Advised her that it does reflect both days. Patient cannot see the letter in her mychart so would like to request it to be resent to her mychart or copied/pasted in a message to her so she can take a photo of it and give to her employer.
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
## 2022-09-10 ENCOUNTER — Encounter: Payer: Self-pay | Admitting: *Deleted

## 2022-09-10 NOTE — Telephone Encounter (Signed)
Patient notified that letter was resent.  Patient stated that she did receive it.

## 2022-11-12 ENCOUNTER — Ambulatory Visit (INDEPENDENT_AMBULATORY_CARE_PROVIDER_SITE_OTHER): Payer: BC Managed Care – PPO | Admitting: Family

## 2022-11-12 ENCOUNTER — Encounter: Payer: Self-pay | Admitting: Family

## 2022-11-12 ENCOUNTER — Other Ambulatory Visit (HOSPITAL_COMMUNITY)
Admission: RE | Admit: 2022-11-12 | Discharge: 2022-11-12 | Disposition: A | Discharge status: Home / Self Care | Payer: BC Managed Care – PPO | Source: Ambulatory Visit | Attending: Family | Admitting: Family

## 2022-11-12 VITALS — BP 118/64 | HR 90 | Resp 19 | Ht 61.0 in | Wt 212.0 lb

## 2022-11-12 DIAGNOSIS — N949 Unspecified condition associated with female genital organs and menstrual cycle: Secondary | ICD-10-CM | POA: Insufficient documentation

## 2022-11-12 MED ORDER — FLUCONAZOLE 150 MG PO TABS: ORAL_TABLET | ORAL | 0 refills | Status: DC

## 2022-11-12 NOTE — Patient Instructions (Signed)
Will go ahead and treat for yeast infection; will follow up once we receive your results and will adjust your treatment as needed;
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
## 2022-11-15 LAB — CERVICOVAGINAL ANCILLARY ONLY
Bacterial Vaginitis (gardnerella): POSITIVE — AB
Candida Glabrata: NEGATIVE
Candida Vaginitis: NEGATIVE
Chlamydia: NEGATIVE
Comment: NEGATIVE
Comment: NEGATIVE
Comment: NEGATIVE
Comment: NEGATIVE
Comment: NEGATIVE
Comment: NORMAL
Neisseria Gonorrhea: NEGATIVE
Trichomonas: NEGATIVE

## 2022-11-16 ENCOUNTER — Other Ambulatory Visit: Payer: Self-pay | Admitting: Family

## 2022-11-16 ENCOUNTER — Ambulatory Visit: Payer: BC Managed Care – PPO | Admitting: Family Medicine

## 2022-11-16 MED ORDER — METRONIDAZOLE 500 MG PO TABS: 500.0000 mg | ORAL_TABLET | Freq: Two times a day (BID) | ORAL | 0 refills | Status: AC

## 2022-12-10 DIAGNOSIS — M7591 Shoulder lesion, unspecified, right shoulder: Secondary | ICD-10-CM | POA: Diagnosis not present

## 2022-12-10 DIAGNOSIS — I10 Essential (primary) hypertension: Secondary | ICD-10-CM | POA: Diagnosis not present

## 2022-12-10 DIAGNOSIS — M7551 Bursitis of right shoulder: Secondary | ICD-10-CM | POA: Diagnosis not present

## 2022-12-10 DIAGNOSIS — M19011 Primary osteoarthritis, right shoulder: Secondary | ICD-10-CM | POA: Diagnosis not present

## 2022-12-12 DIAGNOSIS — Z1211 Encounter for screening for malignant neoplasm of colon: Secondary | ICD-10-CM | POA: Diagnosis not present

## 2022-12-22 DIAGNOSIS — H524 Presbyopia: Secondary | ICD-10-CM | POA: Diagnosis not present

## 2023-01-18 ENCOUNTER — Encounter: Payer: Self-pay | Admitting: Family Medicine

## 2023-01-18 ENCOUNTER — Ambulatory Visit (INDEPENDENT_AMBULATORY_CARE_PROVIDER_SITE_OTHER): Payer: BC Managed Care – PPO | Admitting: Family Medicine

## 2023-01-18 VITALS — BP 112/80 | HR 112 | Temp 98.6°F | Resp 18 | Ht 61.0 in | Wt 222.6 lb

## 2023-01-18 DIAGNOSIS — N390 Urinary tract infection, site not specified: Secondary | ICD-10-CM | POA: Diagnosis not present

## 2023-01-18 DIAGNOSIS — R300 Dysuria: Secondary | ICD-10-CM | POA: Diagnosis not present

## 2023-01-18 DIAGNOSIS — R319 Hematuria, unspecified: Secondary | ICD-10-CM | POA: Diagnosis not present

## 2023-01-18 LAB — POC URINALSYSI DIPSTICK (AUTOMATED)
Glucose, UA: NEGATIVE
Nitrite, UA: POSITIVE
Protein, UA: POSITIVE — AB
Spec Grav, UA: 1.025 (ref 1.010–1.025)
Urobilinogen, UA: 0.2 E.U./dL
pH, UA: 6 (ref 5.0–8.0)

## 2023-01-18 MED ORDER — NITROFURANTOIN MONOHYD MACRO 100 MG PO CAPS: 100.0000 mg | ORAL_CAPSULE | Freq: Two times a day (BID) | ORAL | 0 refills | Status: DC

## 2023-01-18 MED ORDER — FLUCONAZOLE 150 MG PO TABS: 150.0000 mg | ORAL_TABLET | Freq: Every day | ORAL | 0 refills | Status: DC

## 2023-01-18 NOTE — Assessment & Plan Note (Signed)
Macrobid x 7 days  Pt has pyridium at home  Return to office 2 weeks to recheck urine--- consider nitrofurantoin 50 mg daily

## 2023-01-18 NOTE — Progress Notes (Signed)
Subjective:   By signing my name below, I, [name], attest that this documentation has been prepared under the direction and in the presence of Donato Schultz, DO. 01/18/2023   Patient ID: Sheena Olsen, female    DOB: 04-10-1976, 47 y.o.   MRN: [id number]  Chief Complaint  Patient presents with   Dysuria    Sxs started last night. Pt having freq and burning    Dysuria  Associated symptoms include frequency.   Patient is in today for a office visit.   She complains of burning, frequency, pressure on her bladder since last night. She was woken up last night from her symptoms. She's had 5 UTI's this past year.    Past Medical History:  Diagnosis Date   Allergy    Migraines    Since age 72   Uterine fibroid     Past Surgical History:  Procedure Laterality Date   ABDOMINAL HYSTERECTOMY  07/30/2019   CESAREAN SECTION     CHOLECYSTECTOMY     SHOULDER SURGERY Right    Right   WISDOM TOOTH EXTRACTION  01/25/2020    Family History  Problem Relation Age of Onset   Diabetes Other    Hypertension Other    Alzheimer's disease Other    Thyroid disease Mother    Migraines Neg Hx    Colon cancer Neg Hx    Esophageal cancer Neg Hx    Stomach cancer Neg Hx    Rectal cancer Neg Hx     Social History   Socioeconomic History   Marital status: Married    Spouse name: Not on file   Number of children: 1   Years of education: Masters   Highest education level: Not on file  Occupational History   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  Tobacco Use   Smoking status: Never   Smokeless tobacco: Never  Vaping Use   Vaping Use: Never used  Substance and Sexual Activity   Alcohol use: Yes    Alcohol/week: 0.0 standard drinks of alcohol    Comment: Rare   Drug use: No   Sexual activity: Not on file  Other Topics Concern   Not on file  Social History Narrative   Lives with son    Caffeine use: 1 cup coffee per day or tea   Social Determinants of Health   Financial  Resource Strain: Not on file  Food Insecurity: Not on file  Transportation Needs: Not on file  Physical Activity: Not on file  Stress: Not on file  Social Connections: Not on file  Intimate Partner Violence: Not on file    Outpatient Medications Prior to Visit  Medication Sig Dispense Refill   ibuprofen (ADVIL) 800 MG tablet Take 1 tablet (800 mg total) by mouth every 8 (eight) hours as needed. 30 tablet 0   meloxicam (MOBIC) 15 MG tablet Take 1 tablet (15 mg total) by mouth daily. 30 tablet 0   omeprazole (PRILOSEC) 20 MG capsule TAKE 1 CAPSULE BY MOUTH EVERY DAY Please call [phone] to schedule an appointment for more refills 90 capsule 0   chlorpheniramine (CHLOR-TRIMETON) 4 MG tablet Take 4 mg by mouth 2 (two) times daily as needed for allergies. (Patient not taking: Reported on 11/12/2022)     cetirizine (ZYRTEC) 10 MG tablet Take 1 tablet (10 mg total) by mouth daily. (Patient not taking: Reported on 11/12/2022) 30 tablet 0   fluconazole (DIFLUCAN) 150 MG tablet Take 1 tablet at onset; repeat after 72  hours; (Patient not taking: Reported on 01/18/2023) 2 tablet 0   No facility-administered medications prior to visit.    Allergies  Allergen Reactions   Other     Pollen, cat   Cat Hair Extract Itching   Gramineae Pollens Itching    Review of Systems  Genitourinary:  Positive for dysuria and frequency.       (+)bladder pressure       Objective:    Physical Exam Constitutional:      General: She is not in acute distress.    Appearance: Normal appearance. She is not ill-appearing.  HENT:     Head: Normocephalic and atraumatic.     Right Ear: External ear normal.     Left Ear: External ear normal.  Eyes:     Extraocular Movements: Extraocular movements intact.     Pupils: Pupils are equal, round, and reactive to light.  Cardiovascular:     Rate and Rhythm: Normal rate and regular rhythm.     Heart sounds: Normal heart sounds. No murmur heard.    No gallop.   Pulmonary:     Effort: Pulmonary effort is normal. No respiratory distress.     Breath sounds: Normal breath sounds. No wheezing or rales.  Genitourinary:    Comments: Diagnosed with UTI in office Skin:    General: Skin is warm and dry.  Neurological:     Mental Status: She is alert and oriented to person, place, and time.  Psychiatric:        Judgment: Judgment normal.     BP 112/80 (BP Location: Right Arm, Patient Position: Sitting, Cuff Size: Large)   Pulse (!) 112   Temp 98.6 F (37 C) (Oral)   Resp 18   Ht  (1.549 m)   Wt 222 lb 9.6 oz (101 kg)   LMP 02/10/2017 (Exact Date)   SpO2 99%   BMI 42.06 kg/m  Wt Readings from Last 3 Encounters:  01/18/23 222 lb 9.6 oz (101 kg)  11/12/22 212 lb (96.2 kg)  09/09/22 209 lb 6.4 oz (95 kg)       Assessment & Plan:  Dysuria -     POCT Urinalysis Dipstick (Automated) -     Urine Culture -     Fluconazole; Take 1 tablet (150 mg total) by mouth daily. May repeat in 3 days if needed.  Dispense: 2 tablet; Refill: 0  Urinary tract infection with hematuria, site unspecified Assessment & Plan: Macrobid x 7 days  Pt has pyridium at home  Return to office 2 weeks to recheck urine--- consider nitrofurantoin 50 mg daily  Orders: -     Nitrofurantoin Monohyd Macro; Take 1 capsule (100 mg total) by mouth 2 (two) times daily.  Dispense: 14 capsule; Refill: 0 -     Fluconazole; Take 1 tablet (150 mg total) by mouth daily. May repeat in 3 days if needed.  Dispense: 2 tablet; Refill: 0 -     POCT Urinalysis Dipstick (Automated); Future    I, Donato Schultz, DO, personally preformed the services described in this documentation.  All medical record entries made by the scribe were at my direction and in my presence.  I have reviewed the chart and discharge instructions (if applicable) and agree that the record reflects my personal performance and is accurate and complete. 01/18/2023   I,[name],acting as a scribe for Donato Schultz, DO.,have documented all relevant documentation on the behalf of Donato Schultz, DO,as  directed by  [name], DO while in the presence of [name], DO.   [name], DO

## 2023-01-20 LAB — URINE CULTURE
MICRO NUMBER:: 14862143
SPECIMEN QUALITY:: ADEQUATE

## 2023-02-03 ENCOUNTER — Other Ambulatory Visit (INDEPENDENT_AMBULATORY_CARE_PROVIDER_SITE_OTHER): Payer: BC Managed Care – PPO

## 2023-02-03 DIAGNOSIS — R319 Hematuria, unspecified: Secondary | ICD-10-CM

## 2023-02-03 DIAGNOSIS — N390 Urinary tract infection, site not specified: Secondary | ICD-10-CM

## 2023-02-03 LAB — POC URINALSYSI DIPSTICK (AUTOMATED)
Bilirubin, UA: NEGATIVE
Glucose, UA: NEGATIVE
Ketones, UA: NEGATIVE
Nitrite, UA: NEGATIVE
Protein, UA: NEGATIVE
Spec Grav, UA: 1.015 (ref 1.010–1.025)
Urobilinogen, UA: 0.2 E.U./dL
pH, UA: 5 (ref 5.0–8.0)

## 2023-02-03 NOTE — Addendum Note (Signed)
Addended by: Maximino Sarin on: 02/03/2023 03:25 PM   Modules accepted: Orders

## 2023-02-04 LAB — URINE CULTURE
MICRO NUMBER:: 14935384
SPECIMEN QUALITY:: ADEQUATE

## 2023-03-04 ENCOUNTER — Ambulatory Visit
Admission: RE | Admit: 2023-03-04 | Discharge: 2023-03-04 | Disposition: A | Discharge status: Home / Self Care | Payer: BC Managed Care – PPO | Source: Ambulatory Visit | Attending: Obstetrics and Gynecology | Admitting: Obstetrics and Gynecology

## 2023-03-04 ENCOUNTER — Other Ambulatory Visit: Payer: Self-pay | Admitting: Obstetrics and Gynecology

## 2023-03-04 DIAGNOSIS — Z01419 Encounter for gynecological examination (general) (routine) without abnormal findings: Secondary | ICD-10-CM | POA: Diagnosis not present

## 2023-03-04 DIAGNOSIS — Z0000 Encounter for general adult medical examination without abnormal findings: Secondary | ICD-10-CM

## 2023-03-04 DIAGNOSIS — Z1231 Encounter for screening mammogram for malignant neoplasm of breast: Secondary | ICD-10-CM | POA: Diagnosis not present

## 2023-03-09 ENCOUNTER — Other Ambulatory Visit: Payer: Self-pay | Admitting: Obstetrics and Gynecology

## 2023-03-09 DIAGNOSIS — R928 Other abnormal and inconclusive findings on diagnostic imaging of breast: Secondary | ICD-10-CM

## 2023-03-17 ENCOUNTER — Ambulatory Visit
Admission: RE | Admit: 2023-03-17 | Discharge: 2023-03-17 | Disposition: A | Discharge status: Home / Self Care | Payer: BC Managed Care – PPO | Source: Ambulatory Visit | Attending: Obstetrics and Gynecology | Admitting: Obstetrics and Gynecology

## 2023-03-17 DIAGNOSIS — N6312 Unspecified lump in the right breast, upper inner quadrant: Secondary | ICD-10-CM | POA: Diagnosis not present

## 2023-03-17 DIAGNOSIS — R928 Other abnormal and inconclusive findings on diagnostic imaging of breast: Secondary | ICD-10-CM

## 2023-04-19 IMAGING — US US THYROID
1 series · 13 of 25 positions shown · non-contrast
Comparison: 01/09/2022

CLINICAL DATA: Left thyroid nodule by neck CTA

EXAM:
THYROID ULTRASOUND
TECHNIQUE: Ultrasound examination of the thyroid gland and adjacent soft
tissues was performed.

[Series 1: us thyroid · 13 of 72 slices shown]
[im 1/72]
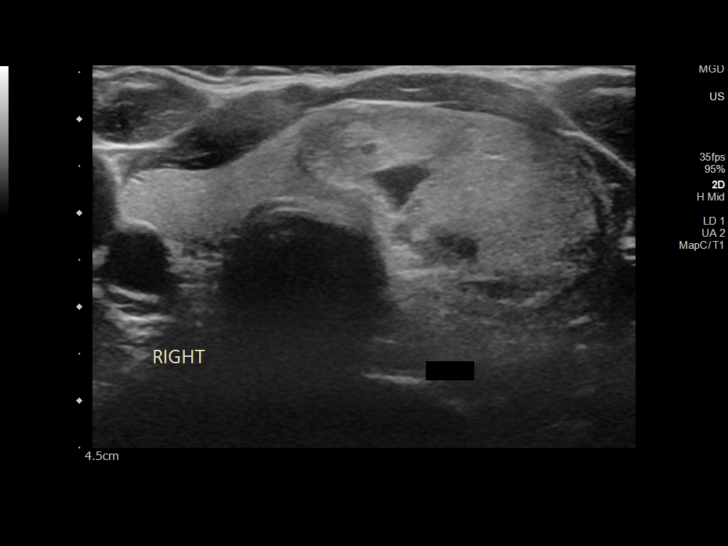
[im 6/72]
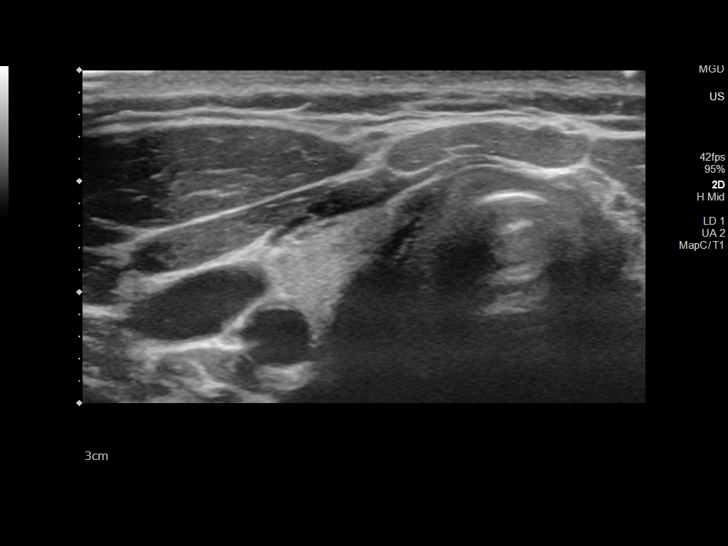
[im 12/72]
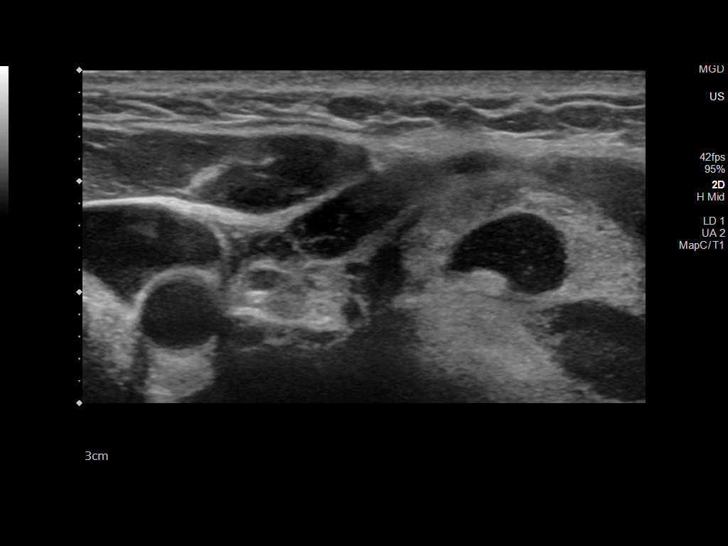
[im 18/72]
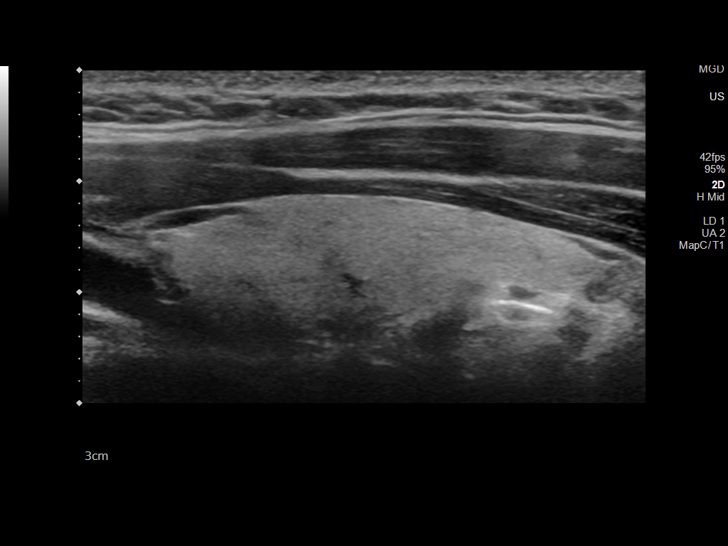
[im 24/72]
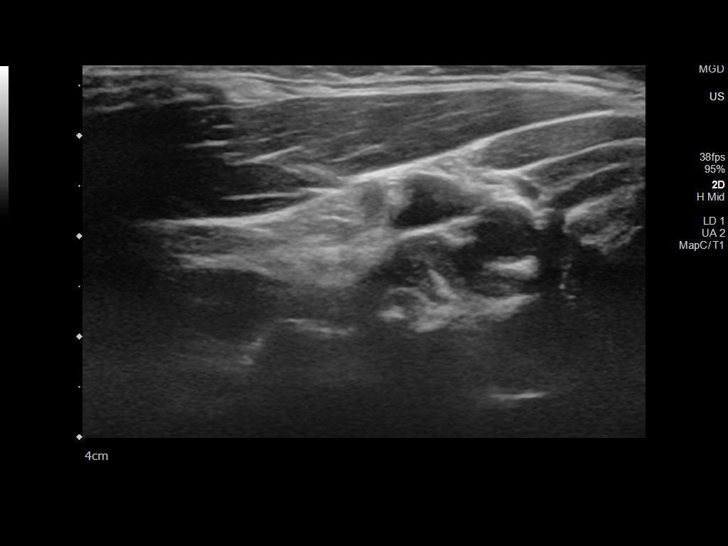
[im 30/72]
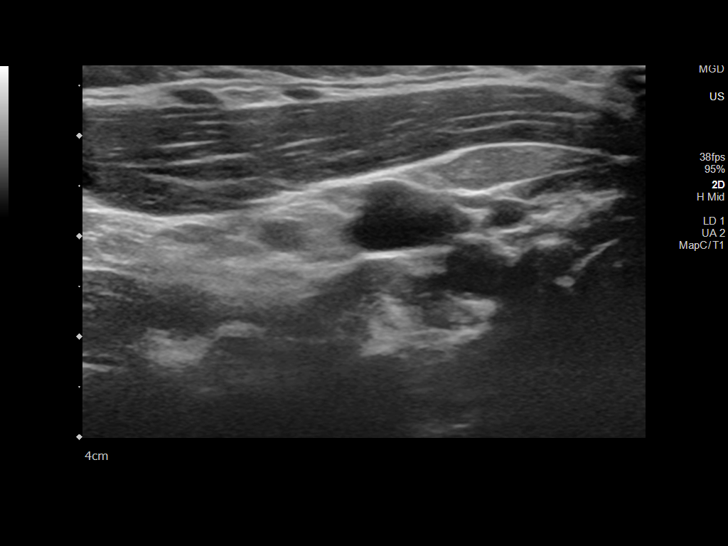
[im 36/72]
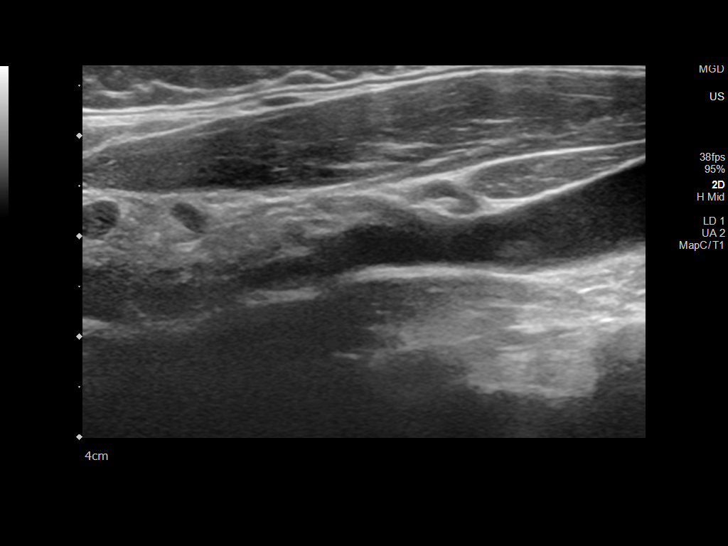
[im 42/72]
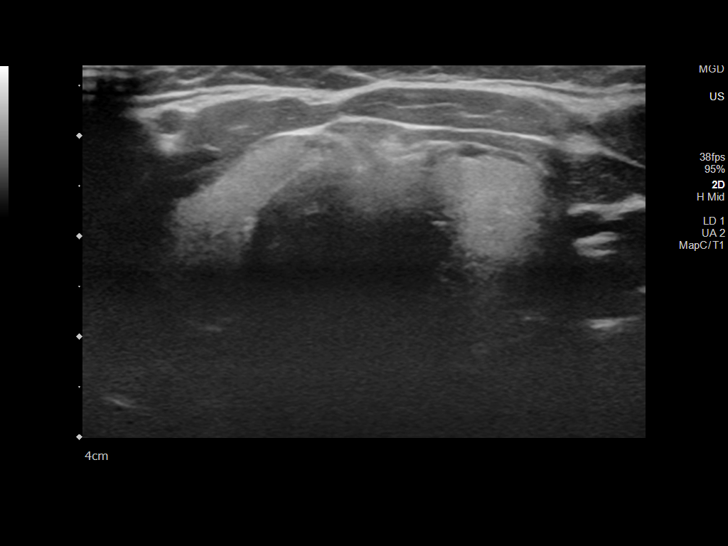
[im 48/72]
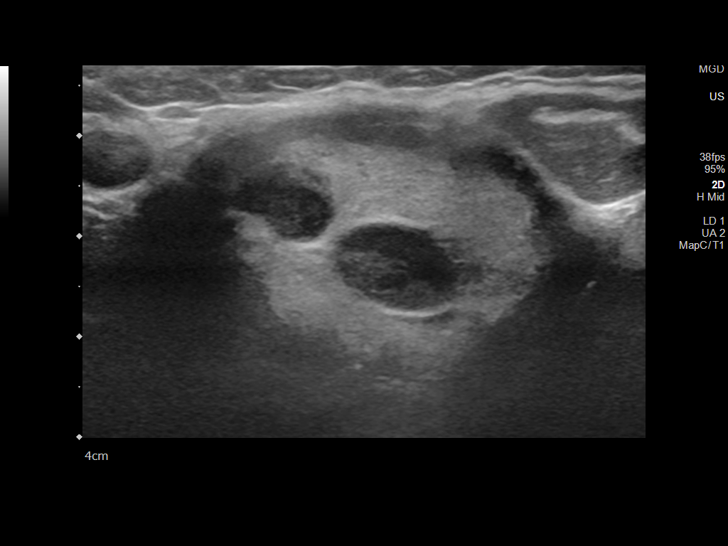
[im 54/72]
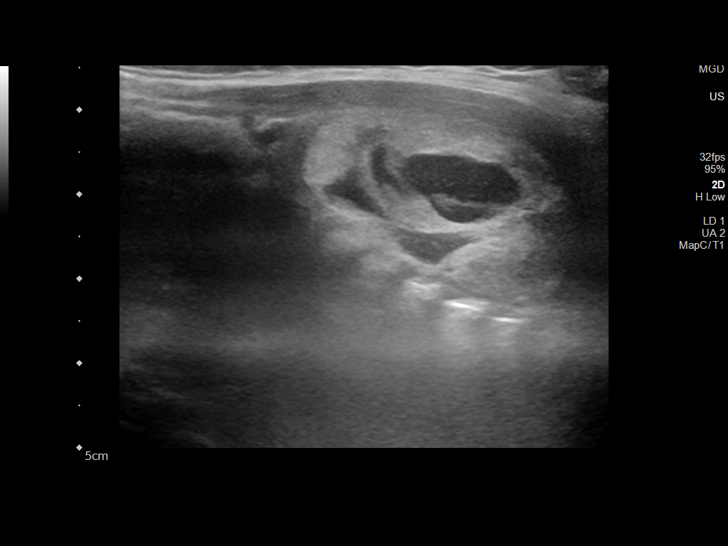
[im 60/72]
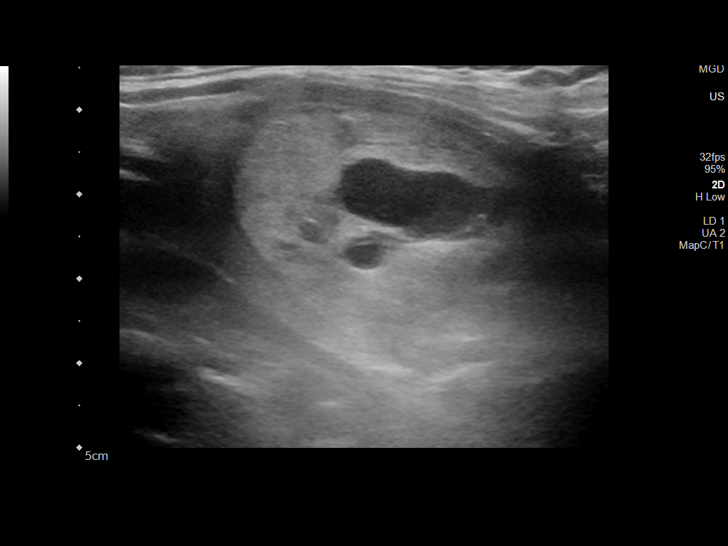
[im 66/72]
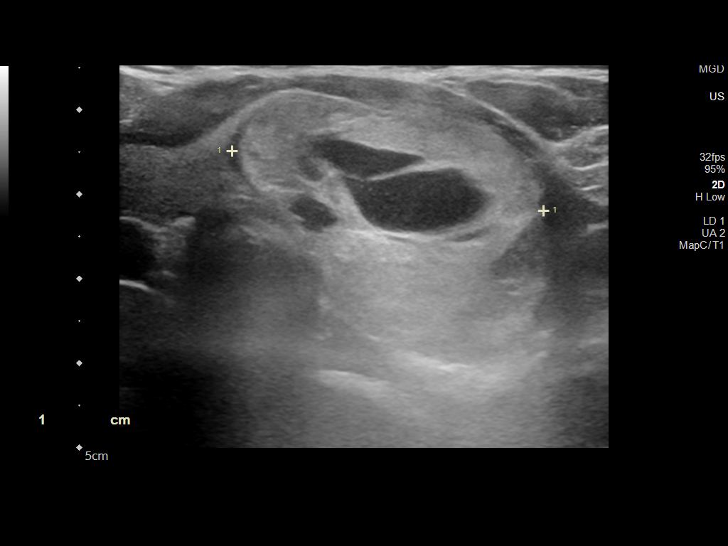
[im 72/72]
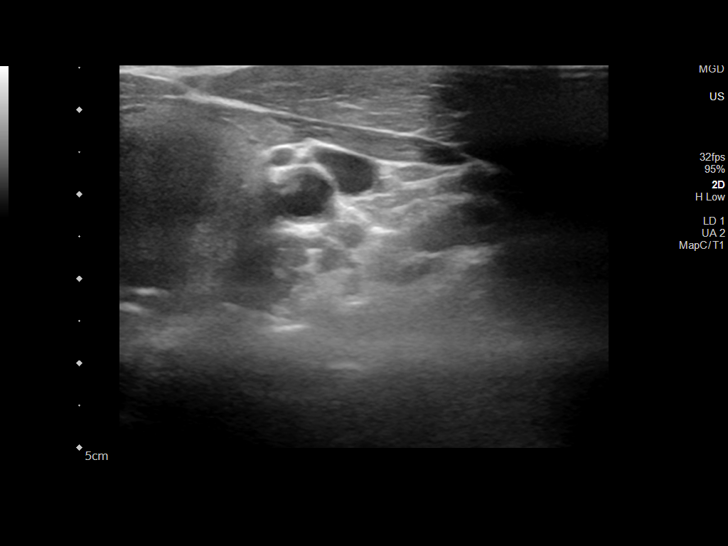

[13 of 25 positions shown; findings below may reference images not displayed]

FINDINGS: Parenchymal Echotexture: Normal

Isthmus: 4 mm

Right lobe: 4.0 x 1.2 x 1.4 cm

Left lobe:  4.6 x 1.7 x 3.9 cm

_________________________________________________________

Estimated total number of nodules >/= 1 cm: 1

Number of spongiform nodules >/=  2 cm not described below (TR1): 0

Number of mixed cystic and solid nodules >/= 1.5 cm not described
below (TR2): 0

_________________________________________________________

Nodule # 1:

Location: Left; Mid

Maximum size: 3.7 cm; Other 2 dimensions: 2.2 x 3.7 cm

Composition: solid/almost completely solid (2)

Echogenicity: isoechoic (1)

Shape: not taller-than-wide (0)

Margins: ill-defined (0)

Echogenic foci: none (0)

ACR TI-RADS total points: 3.

ACR TI-RADS risk category: TR3 (3 points).

ACR TI-RADS recommendations:

**Given size (>/= 2.5 cm) and appearance, fine needle aspiration of
this mildly suspicious nodule should be considered based on TI-RADS
criteria.

_________________________________________________________

No additional thyroid abnormality. No hypervascularity. Negative for
regional adenopathy.
IMPRESSION: 3.7 cm left mid thyroid TR 3 nodule meets criteria for biopsy as
above. This correlates with the neck CTA finding.

The above is in keeping with the ACR TI-RADS recommendations - [HOSPITAL] 9232;[DATE].

## 2023-06-07 DIAGNOSIS — R61 Generalized hyperhidrosis: Secondary | ICD-10-CM | POA: Diagnosis not present

## 2023-06-07 DIAGNOSIS — Z79890 Hormone replacement therapy (postmenopausal): Secondary | ICD-10-CM | POA: Diagnosis not present

## 2023-06-24 ENCOUNTER — Ambulatory Visit (HOSPITAL_BASED_OUTPATIENT_CLINIC_OR_DEPARTMENT_OTHER)
Admission: RE | Admit: 2023-06-24 | Discharge: 2023-06-24 | Disposition: A | Discharge status: Home / Self Care | Payer: BC Managed Care – PPO | Source: Ambulatory Visit | Attending: Family Medicine | Admitting: Family Medicine

## 2023-06-24 ENCOUNTER — Encounter: Payer: Self-pay | Admitting: Family Medicine

## 2023-06-24 ENCOUNTER — Ambulatory Visit (INDEPENDENT_AMBULATORY_CARE_PROVIDER_SITE_OTHER): Payer: BC Managed Care – PPO | Admitting: Family Medicine

## 2023-06-24 VITALS — BP 113/82 | HR 84 | Ht 61.0 in | Wt 220.0 lb

## 2023-06-24 DIAGNOSIS — M25562 Pain in left knee: Secondary | ICD-10-CM | POA: Diagnosis not present

## 2023-06-24 DIAGNOSIS — M25561 Pain in right knee: Secondary | ICD-10-CM | POA: Diagnosis not present

## 2023-06-24 MED ORDER — NAPROXEN 500 MG PO TABS
500.0000 mg | ORAL_TABLET | Freq: Two times a day (BID) | ORAL | 1 refills | Status: DC
Start: 2023-06-24 — End: 2023-08-22

## 2023-06-24 NOTE — Progress Notes (Signed)
Acute Office Visit  Subjective:     Patient ID: Sheena Olsen, female    DOB: [birth year], 47 y.o.   MRN: [id number]  Chief Complaint  Patient presents with   Knee Pain     Patient is in today for knee pain.   Discussed the use of AI scribe software for clinical note transcription with the patient, who gave verbal consent to proceed.  History of Present Illness   The patient, with a history of right knee issues since her time in the [employer]	[place], presents with left knee pain that has been ongoing for approximately two weeks. The pain began suddenly one morning after a night shift at [facility], with no recollection of a specific injury or trauma. The pain is located laterally and posteriorly, described as a throbbing sensation. The patient reports a loss of flexibility, with pain increasing when attempting to stretch and flex the knee. Shee also reports clicking and a feeling of instability and clicking in the knee.  Despite attempts at self-management with ice, ibuprofen, and meloxicam, the patient reports no significant improvement in either the swelling or the pain. The knee is consistently stiff and tight, and does not loosen up throughout the day. She also reports that the knee feels as though it may give out while walking.  In the past, the patient had issues with her right meniscus, which was managed with physical therapy. The current left knee pain is a new and different experience for the patient.            ROS All review of systems negative except what is listed in the HPI      Objective:    BP 113/82   Pulse 84   Ht 5\' 1"  (1.549 m)   Wt 220 lb (99.8 kg)   LMP 02/10/2017 (Exact Date)   SpO2 99%   BMI 41.57 kg/m    Physical Exam Vitals reviewed.  Constitutional:      Appearance: Normal appearance.  Musculoskeletal:     Comments: Left knee with mild edema, no erythema or bruising Pain with lateral palpation and limited flexion  Neurological:     Mental  Status: She is alert and oriented to person, place, and time.  Psychiatric:        Mood and Affect: Mood normal.        Behavior: Behavior normal.        Thought Content: Thought content normal.        Judgment: Judgment normal.        No results found for any visits on 06/24/23.      Assessment & Plan:   Problem List Items Addressed This Visit   None Visit Diagnoses     Acute pain of left knee    -  Primary   Relevant Medications   naproxen (NAPROSYN) 500 MG tablet   Other Relevant Orders   DG Knee Complete 4 Views Left   DG Knee 1-2 Views Right   Ambulatory referral to Sports Medicine         Left Knee Pain Acute onset of left knee pain and swelling for two weeks, with no history of trauma. Pain is throbbing in nature, located on the lateral and posterior aspects of the knee. There is decreased flexibility, instability, and clicking. Pain is exacerbated by bending, squatting, and climbing stairs. Previous history of right knee meniscus injury. Non-responsive to ice, ibuprofen, and meloxicam. -Order knee X-rays for comparison and to assess for any  acute changes. -Discontinue meloxicam and ibuprofen. -Start Naproxen prescription strength twice daily for inflammation. -Advise to use heat, ice, and gentle stretches. -Recommend using a knee brace for support and compression. -Refer to Sports Medicine for further evaluation and management. -Consider Physical Therapy         Meds ordered this encounter  Medications   naproxen (NAPROSYN) 500 MG tablet    Sig: Take 1 tablet (500 mg total) by mouth 2 (two) times daily with a meal.    Dispense:  60 tablet    Refill:  1    Order Specific Question:   Supervising Provider    Answer:   Danise Edge A [[id number]]     Return if symptoms worsen or fail to improve.  Clayborne Dana, NP

## 2023-06-25 ENCOUNTER — Encounter: Payer: Self-pay | Admitting: Family Medicine

## 2023-06-25 DIAGNOSIS — M25562 Pain in left knee: Secondary | ICD-10-CM

## 2023-06-27 MED ORDER — TRAMADOL HCL 50 MG PO TABS
50.0000 mg | ORAL_TABLET | Freq: Three times a day (TID) | ORAL | 0 refills | Status: AC | PRN
Start: 2023-06-27 — End: 2023-06-30

## 2023-06-29 ENCOUNTER — Ambulatory Visit (INDEPENDENT_AMBULATORY_CARE_PROVIDER_SITE_OTHER): Payer: BC Managed Care – PPO | Admitting: Sports Medicine

## 2023-06-29 ENCOUNTER — Encounter: Payer: Self-pay | Admitting: Sports Medicine

## 2023-06-29 VITALS — BP 110/82 | Ht 61.0 in | Wt 218.0 lb

## 2023-06-29 DIAGNOSIS — M25562 Pain in left knee: Secondary | ICD-10-CM

## 2023-06-29 MED ORDER — METHYLPREDNISOLONE ACETATE 40 MG/ML IJ SUSP
40.0000 mg | Freq: Once | INTRAMUSCULAR | Status: AC
Start: 2023-06-29 — End: 2023-06-29
  Administered 2023-06-29: 40 mg via INTRA_ARTICULAR

## 2023-06-29 NOTE — Progress Notes (Signed)
   Subjective:    Patient ID: Sheena Olsen, female    DOB: Sep 02, 1976, 47 y.o.   MRN: [id number]  HPI chief complaint: Left knee pain  Patient is a very pleasant 47 year old female that presents today with approximately 3 weeks of left knee pain.  She does not recall any specific trauma.  She does a lot of walking and awoke 1 morning with pain and swelling in the knee.  She saw her primary care physician last Friday who recommended some Naprosyn, compression sleeve, and ice.  These have been helpful but not curative.  Pain is diffuse throughout the knee.  She denies any significant problems with this knee in the past.  No prior knee surgeries.  She does endorse some locking and catching in the knee.  Past medical history reviewed Medications reviewed Allergies reviewed    Review of Systems As above    Objective:   Physical Exam  Well-developed, well-nourished.  No acute distress  Left knee: Range of motion is 0 to about 100 degrees.  Trace to 1+ effusion.  She is tender to palpation along the medial joint line with a positive McMurray's.  Positive Thessaly's.  Knee is stable to valgus and varus stressing.  Negative anterior drawer, negative posterior drawer.  Neurovascularly intact distally.  X-rays of the left knee dated June 24, 2023 are reviewed.  Nothing acute is seen.      Assessment & Plan:   Left knee pain likely secondary to DJD versus degenerative meniscal tear  I discussed cortisone injection versus MRI.  She is elected to try the cortisone injection.  She has had good success with injections and other body parts in the past.  This is accomplished atraumatically under sterile technique utilizing anterior lateral approach.  She tolerates this without difficulty.  If symptoms persist despite today's injection then consider MRI to rule out meniscus tear.  Follow-up for ongoing or recalcitrant issues.  Consent obtained and verified. Time-out conducted. Noted no  overlying erythema, induration, or other signs of local infection. Skin prepped in a sterile fashion. Topical analgesic spray: Ethyl chloride. Joint: Left knee Needle: 25-gauge 1.5 inch Completed without difficulty. Meds: 2 cc 1% Xylocaine, 1 cc (40 mg) Depo-Medrol  This note was dictated using Dragon naturally speaking software and may contain errors in syntax, spelling, or content which have not been identified prior to signing this note.

## 2023-07-07 ENCOUNTER — Encounter: Payer: Self-pay | Admitting: Sports Medicine

## 2023-07-29 ENCOUNTER — Other Ambulatory Visit: Payer: Self-pay | Admitting: *Deleted

## 2023-07-29 DIAGNOSIS — M25562 Pain in left knee: Secondary | ICD-10-CM

## 2023-08-07 ENCOUNTER — Ambulatory Visit (HOSPITAL_BASED_OUTPATIENT_CLINIC_OR_DEPARTMENT_OTHER)
Admission: RE | Admit: 2023-08-07 | Discharge: 2023-08-07 | Disposition: A | Discharge status: Home / Self Care | Payer: BC Managed Care – PPO | Source: Ambulatory Visit | Attending: Sports Medicine | Admitting: Sports Medicine

## 2023-08-07 DIAGNOSIS — M948X6 Other specified disorders of cartilage, lower leg: Secondary | ICD-10-CM | POA: Diagnosis not present

## 2023-08-07 DIAGNOSIS — M25562 Pain in left knee: Secondary | ICD-10-CM | POA: Diagnosis not present

## 2023-08-07 DIAGNOSIS — M25462 Effusion, left knee: Secondary | ICD-10-CM | POA: Diagnosis not present

## 2023-08-19 ENCOUNTER — Other Ambulatory Visit: Payer: Self-pay | Admitting: Family Medicine

## 2023-08-19 DIAGNOSIS — M25562 Pain in left knee: Secondary | ICD-10-CM

## 2023-08-19 NOTE — Progress Notes (Signed)
Patient was contacted today, discussed MRI findings and options for management. CSI lasted no longer than 2 weeks. Opts for physical therapy, order placed. Return precautions reviewed, patient verbalized understanding an in agreement with plan.

## 2023-08-21 ENCOUNTER — Other Ambulatory Visit: Payer: Self-pay | Admitting: Family Medicine

## 2023-08-21 DIAGNOSIS — M25562 Pain in left knee: Secondary | ICD-10-CM
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
## 2023-09-07 ENCOUNTER — Ambulatory Visit: Payer: BC Managed Care – PPO | Attending: Family Medicine | Admitting: Physical Therapy

## 2023-09-07 ENCOUNTER — Encounter: Payer: Self-pay | Admitting: Physical Therapy

## 2023-09-07 ENCOUNTER — Other Ambulatory Visit: Payer: Self-pay

## 2023-09-07 DIAGNOSIS — R531 Weakness: Secondary | ICD-10-CM | POA: Insufficient documentation

## 2023-09-07 DIAGNOSIS — M25562 Pain in left knee: Secondary | ICD-10-CM | POA: Diagnosis not present

## 2023-09-07 DIAGNOSIS — R252 Cramp and spasm: Secondary | ICD-10-CM | POA: Diagnosis not present

## 2023-09-07 DIAGNOSIS — G8929 Other chronic pain: Secondary | ICD-10-CM | POA: Diagnosis not present

## 2023-09-15 ENCOUNTER — Ambulatory Visit: Payer: BC Managed Care – PPO

## 2023-09-15 DIAGNOSIS — R252 Cramp and spasm: Secondary | ICD-10-CM | POA: Diagnosis not present

## 2023-09-15 DIAGNOSIS — M25562 Pain in left knee: Secondary | ICD-10-CM | POA: Diagnosis not present

## 2023-09-15 DIAGNOSIS — G8929 Other chronic pain: Secondary | ICD-10-CM

## 2023-09-15 DIAGNOSIS — R531 Weakness: Secondary | ICD-10-CM | POA: Diagnosis not present
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
## 2023-09-22 ENCOUNTER — Ambulatory Visit: Payer: BC Managed Care – PPO

## 2023-09-29 ENCOUNTER — Ambulatory Visit: Payer: BC Managed Care – PPO | Attending: Family Medicine

## 2023-09-29 ENCOUNTER — Other Ambulatory Visit: Payer: Self-pay

## 2023-09-29 DIAGNOSIS — R252 Cramp and spasm: Secondary | ICD-10-CM | POA: Insufficient documentation

## 2023-09-29 DIAGNOSIS — R531 Weakness: Secondary | ICD-10-CM | POA: Diagnosis not present

## 2023-09-29 DIAGNOSIS — G8929 Other chronic pain: Secondary | ICD-10-CM | POA: Insufficient documentation

## 2023-09-29 DIAGNOSIS — M25562 Pain in left knee: Secondary | ICD-10-CM | POA: Insufficient documentation
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
## 2023-10-06 ENCOUNTER — Ambulatory Visit: Payer: BC Managed Care – PPO

## 2023-10-06 DIAGNOSIS — R531 Weakness: Secondary | ICD-10-CM | POA: Diagnosis not present

## 2023-10-06 DIAGNOSIS — R252 Cramp and spasm: Secondary | ICD-10-CM

## 2023-10-06 DIAGNOSIS — G8929 Other chronic pain: Secondary | ICD-10-CM | POA: Diagnosis not present

## 2023-10-06 DIAGNOSIS — M25562 Pain in left knee: Secondary | ICD-10-CM | POA: Diagnosis not present
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
## 2023-10-13 ENCOUNTER — Ambulatory Visit: Payer: BC Managed Care – PPO

## 2023-10-20 ENCOUNTER — Other Ambulatory Visit: Payer: Self-pay

## 2023-10-20 ENCOUNTER — Ambulatory Visit: Payer: BC Managed Care – PPO

## 2023-10-20 DIAGNOSIS — M25562 Pain in left knee: Secondary | ICD-10-CM | POA: Diagnosis not present

## 2023-10-20 DIAGNOSIS — G8929 Other chronic pain: Secondary | ICD-10-CM | POA: Diagnosis not present

## 2023-10-20 DIAGNOSIS — R531 Weakness: Secondary | ICD-10-CM

## 2023-10-20 DIAGNOSIS — R252 Cramp and spasm: Secondary | ICD-10-CM | POA: Diagnosis not present
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
## 2023-10-28 ENCOUNTER — Other Ambulatory Visit (HOSPITAL_BASED_OUTPATIENT_CLINIC_OR_DEPARTMENT_OTHER): Payer: Self-pay

## 2023-10-28 ENCOUNTER — Ambulatory Visit: Payer: Self-pay | Admitting: Family Medicine

## 2023-10-28 ENCOUNTER — Ambulatory Visit: Payer: BC Managed Care – PPO | Admitting: Family Medicine

## 2023-10-28 ENCOUNTER — Encounter: Payer: Self-pay | Admitting: Family Medicine

## 2023-10-28 VITALS — BP 120/70 | HR 137 | Temp 100.6°F | Resp 20 | Ht 61.0 in | Wt 229.6 lb

## 2023-10-28 DIAGNOSIS — J101 Influenza due to other identified influenza virus with other respiratory manifestations: Secondary | ICD-10-CM | POA: Diagnosis not present

## 2023-10-28 DIAGNOSIS — R6889 Other general symptoms and signs: Secondary | ICD-10-CM | POA: Diagnosis not present

## 2023-10-28 LAB — POC INFLUENZA A&B (BINAX/QUICKVUE)
Influenza A, POC: POSITIVE — AB
Influenza B, POC: NEGATIVE

## 2023-10-28 MED ORDER — OSELTAMIVIR PHOSPHATE 75 MG PO CAPS
75.0000 mg | ORAL_CAPSULE | Freq: Two times a day (BID) | ORAL | 0 refills | Status: AC
  Filled 2023-10-28: qty 10, 5d supply, fill #0

## 2023-10-28 MED ORDER — FLUTICASONE PROPIONATE 50 MCG/ACT NA SUSP
2.0000 | Freq: Every day | NASAL | 6 refills | Status: AC
  Filled 2023-10-28: qty 16, 30d supply, fill #0

## 2023-10-28 MED ORDER — PROMETHAZINE-DM 6.25-15 MG/5ML PO SYRP
5.0000 mL | ORAL_SOLUTION | Freq: Four times a day (QID) | ORAL | 0 refills | Status: AC | PRN
  Filled 2023-10-28: qty 118, 6d supply, fill #0
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
## 2023-10-28 NOTE — Patient Instructions (Signed)

## 2023-10-28 NOTE — Telephone Encounter (Signed)
Copied from CRM [id number]. Topic: Clinical - Red Word Triage >> Oct 28, 2023  8:51 AM Elizebeth Brooking wrote: [facility] that prompted transfer to Nurse Triage: Patient schedule a appointment stated that her fever was 103 , was called to be triaged by nurses   Chief Complaint: Fever Symptoms: cough, sore throat, fever, headache Pertinent Negatives: Patient denies chest pain, SOB Disposition: [] ED /[] Urgent Care (no appt availability in office) / [x] Appointment(In office/virtual)/ []  [facility]/ [] Home Care/ [] Refused Recommended Disposition /[]  Mobile Bus/ []  Follow-up with PCP  Additional Notes: Patient reported feeling sick for past two days. She reported cough, sore throat, fever, chills, headache, nausea. Temp was 101-102.7 in past couple days. Today, her temp is 103. Patient has appt scheduled for this morning.  Reason for Disposition  Severe chills (i.e., feeling extremely cold WITH shaking chills)  Answer Assessment - Initial Assessment Questions 1. TEMPERATURE: "What is the most recent temperature?"  "How was it measured?"      103  2. ONSET: "When did the fever start?"      Yesterday,   3. CHILLS: "Do you have chills?" If yes: "How bad are they?"  (e.g., none, mild, moderate, severe)   - NONE: no chills   - MILD: feeling cold   - MODERATE: feeling very cold, some shivering (feels better under a thick blanket)   - SEVERE: feeling extremely cold with shaking chills (general body shaking, rigors; even under a thick blanket)      Chills   4. OTHER SYMPTOMS: "Do you have any other symptoms besides the fever?"  (e.g., abdomen pain, cough, diarrhea, earache, headache, sore throat, urination pain)  Severe headache, sore throat      5. CAUSE: If there are no symptoms, ask: "What do you think is causing the fever?"      Unknown  Protocols used: Fever-A-AH

## 2023-10-28 NOTE — Assessment & Plan Note (Signed)
Tamiflu x 5 days  Flonase and phenergan dm Rest , tylenol/ ibuprofen for fever/ bodyaches  Contagious until fever free for 24h with no med in her system Pt written out of work until next Wednesday  Call or return to office as needed

## 2024-03-09 DIAGNOSIS — Z01419 Encounter for gynecological examination (general) (routine) without abnormal findings: Secondary | ICD-10-CM | POA: Diagnosis not present

## 2024-06-20 ENCOUNTER — Ambulatory Visit: Payer: Self-pay

## 2024-06-20 NOTE — Telephone Encounter (Signed)
 FYI Only or Action Required?: FYI only for provider.  Patient was last seen in primary care on 10/28/2023 by Antonio Meth, Jamee SAUNDERS, DO.  Called Nurse Triage reporting Urinary Frequency.  Symptoms began yesterday.  Interventions attempted: Nothing.  Symptoms are: gradually worsening.  Triage Disposition: See Physician Within 24 Hours  Patient/caregiver understands and will follow disposition?: Yes     Copied from CRM #[id number]. Topic: Clinical - Red Word Triage >> Jun 20, 2024  7:34 AM Sheena Olsen wrote: [facility] that prompted transfer to Nurse Triage: Patient thinks she has UTI urgency and discomfort . Symptoms started last night. Reason for Disposition  Age > 50 years  Answer Assessment - Initial Assessment Questions 1. SEVERITY: How bad is the pain?  (e.g., Scale 1-10; mild, moderate, or severe)     moderate 2. FREQUENCY: How many times have you had painful urination today?      Every time 3. PATTERN: Is pain present every time you urinate or just sometimes?      States pressure and no burning 4. ONSET: When did the painful urination start?      Last night 5. FEVER: Do you have Olsen fever? If Yes, ask: What is your temperature, how was it measured, and when did it start?     no 6. PAST UTI: Have you had Olsen urine infection before? If Yes, ask: When was the last time? and What happened that time?      yes 7. CAUSE: What do you think is causing the painful urination?  (e.g., UTI, scratch, Herpes sore)     uti 8. OTHER SYMPTOMS: Do you have any other symptoms? (e.g., blood in urine, flank pain, genital sores, urgency, vaginal discharge)     Urgency, frequency 9. PREGNANCY: Is there any chance you are pregnant? When was your last menstrual period?     na  Protocols used: Urination Pain - Female-Olsen-AH
